# Patient Record
Sex: Female | Born: 1960 | Race: White | Hispanic: No | Marital: Married | Smoking: Never smoker
Health system: Southern US, Community
[De-identification: ages and names within clinical notes are randomized; demographics above are authoritative.]

## PROBLEM LIST (undated history)

## (undated) DIAGNOSIS — E785 Hyperlipidemia, unspecified: Secondary | ICD-10-CM

## (undated) DIAGNOSIS — T7840XA Allergy, unspecified, initial encounter: Secondary | ICD-10-CM

## (undated) DIAGNOSIS — K219 Gastro-esophageal reflux disease without esophagitis: Secondary | ICD-10-CM

## (undated) HISTORY — DX: Allergy, unspecified, initial encounter: T78.40XA

## (undated) HISTORY — DX: Gastro-esophageal reflux disease without esophagitis: K21.9

## (undated) HISTORY — PX: TONSILLECTOMY: SUR1361

## (undated) HISTORY — DX: Hyperlipidemia, unspecified: E78.5

## (undated) HISTORY — PX: TUBAL LIGATION: SHX77

---

## 2005-06-17 ENCOUNTER — Other Ambulatory Visit: Admission: RE | Admit: 2005-06-17 | Discharge: 2005-06-17 | Payer: Self-pay | Admitting: Family Medicine

## 2013-06-08 ENCOUNTER — Encounter (INDEPENDENT_AMBULATORY_CARE_PROVIDER_SITE_OTHER): Payer: Self-pay

## 2013-06-08 ENCOUNTER — Ambulatory Visit (INDEPENDENT_AMBULATORY_CARE_PROVIDER_SITE_OTHER): Payer: 59 | Admitting: General Practice

## 2013-06-08 ENCOUNTER — Encounter: Payer: Self-pay | Admitting: General Practice

## 2013-06-08 ENCOUNTER — Ambulatory Visit: Payer: Self-pay | Admitting: General Practice

## 2013-06-08 VITALS — BP 164/83 | HR 82 | Temp 97.0°F | Ht 62.0 in | Wt 260.0 lb

## 2013-06-08 DIAGNOSIS — K219 Gastro-esophageal reflux disease without esophagitis: Secondary | ICD-10-CM

## 2013-06-08 MED ORDER — OMEPRAZOLE 20 MG PO CPDR: 20.0000 mg | DELAYED_RELEASE_CAPSULE | Freq: Every day | ORAL | Status: DC

## 2013-06-08 NOTE — Patient Instructions (Signed)
Gastroesophageal Reflux Disease, Adult  Gastroesophageal reflux disease (GERD) happens when acid from your stomach flows up into the esophagus. When acid comes in contact with the esophagus, the acid causes soreness (inflammation) in the esophagus. Over time, GERD may create small holes (ulcers) in the lining of the esophagus.  CAUSES   · Increased body weight. This puts pressure on the stomach, making acid rise from the stomach into the esophagus.  · Smoking. This increases acid production in the stomach.  · Drinking alcohol. This causes decreased pressure in the lower esophageal sphincter (valve or ring of muscle between the esophagus and stomach), allowing acid from the stomach into the esophagus.  · Late evening meals and a full stomach. This increases pressure and acid production in the stomach.  · A malformed lower esophageal sphincter.  Sometimes, no cause is found.  SYMPTOMS   · Burning pain in the lower part of the mid-chest behind the breastbone and in the mid-stomach area. This may occur twice a week or more often.  · Trouble swallowing.  · Sore throat.  · Dry cough.  · Asthma-like symptoms including chest tightness, shortness of breath, or wheezing.  DIAGNOSIS   Your caregiver may be able to diagnose GERD based on your symptoms. In some cases, X-rays and other tests may be done to check for complications or to check the condition of your stomach and esophagus.  TREATMENT   Your caregiver may recommend over-the-counter or prescription medicines to help decrease acid production. Ask your caregiver before starting or adding any new medicines.   HOME CARE INSTRUCTIONS   · Change the factors that you can control. Ask your caregiver for guidance concerning weight loss, quitting smoking, and alcohol consumption.  · Avoid foods and drinks that make your symptoms worse, such as:  · Caffeine or alcoholic drinks.  · Chocolate.  · Peppermint or mint flavorings.  · Garlic and onions.  · Spicy foods.  · Citrus fruits,  such as oranges, lemons, or limes.  · Tomato-based foods such as sauce, chili, salsa, and pizza.  · Fried and fatty foods.  · Avoid lying down for the 3 hours prior to your bedtime or prior to taking a nap.  · Eat small, frequent meals instead of large meals.  · Wear loose-fitting clothing. Do not wear anything tight around your waist that causes pressure on your stomach.  · Raise the head of your bed 6 to 8 inches with wood blocks to help you sleep. Extra pillows will not help.  · Only take over-the-counter or prescription medicines for pain, discomfort, or fever as directed by your caregiver.  · Do not take aspirin, ibuprofen, or other nonsteroidal anti-inflammatory drugs (NSAIDs).  SEEK IMMEDIATE MEDICAL CARE IF:   · You have pain in your arms, neck, jaw, teeth, or back.  · Your pain increases or changes in intensity or duration.  · You develop nausea, vomiting, or sweating (diaphoresis).  · You develop shortness of breath, or you faint.  · Your vomit is green, yellow, black, or looks like coffee grounds or blood.  · Your stool is red, bloody, or black.  These symptoms could be signs of other problems, such as heart disease, gastric bleeding, or esophageal bleeding.  MAKE SURE YOU:   · Understand these instructions.  · Will watch your condition.  · Will get help right away if you are not doing well or get worse.  Document Released: 12/09/2004 Document Revised: 05/24/2011 Document Reviewed: 09/18/2010  [employer]® Patient   Information ©2014 [employer].

## 2013-06-08 NOTE — Progress Notes (Signed)
   Subjective:    Patient ID: Kendra Benitez, female    DOB: June 16, 1960, 53 y.o.   MRN: [id number]  Gastrophageal Reflux She complains of belching, choking, coughing and heartburn. She reports no abdominal pain, no chest pain or no sore throat. This is a new problem. The current episode started 1 to 4 weeks ago. The problem occurs frequently. The problem has been gradually worsening. The heartburn duration is an hour. The heartburn does not wake her from sleep. The heartburn does not limit her activity. The heartburn doesn't change with position. The symptoms are aggravated by certain foods and lying down. There are no known risk factors. She has tried an antacid for the symptoms. Past procedures do not include an EGD or esophageal pH monitoring.  Sinusitis This is a new problem. The current episode started in the past 7 days. The problem has been gradually worsening since onset. Associated symptoms include congestion, coughing and sinus pressure. Pertinent negatives include no chills, headaches, shortness of breath or sore throat. Past treatments include nothing.      Review of Systems  Constitutional: Negative for chills.  HENT: Positive for congestion and sinus pressure. Negative for sore throat.   Respiratory: Positive for cough and choking. Negative for chest tightness and shortness of breath.   Cardiovascular: Negative for chest pain and palpitations.  Gastrointestinal: Positive for heartburn. Negative for abdominal pain.  Neurological: Negative for dizziness, weakness and headaches.       Objective:   Physical Exam  Constitutional: She is oriented to person, place, and time. She appears well-developed and well-nourished.  HENT:  Head: Normocephalic and atraumatic.  Right Ear: External ear normal.  Left Ear: External ear normal.  Cardiovascular: Normal rate, regular rhythm and normal heart sounds.   Pulmonary/Chest: Effort normal and breath sounds normal. No respiratory distress.  She exhibits no tenderness.  Abdominal: Soft. Bowel sounds are normal. She exhibits no distension. There is no tenderness.  Neurological: She is alert and oriented to person, place, and time.  Skin: Skin is warm and dry.  Psychiatric: She has a normal mood and affect.          Assessment & Plan:  1. GERD (gastroesophageal reflux disease) - omeprazole (PRILOSEC) 20 MG capsule; Take 1 capsule (20 mg total) by mouth daily.  Dispense: 30 capsule; Refill: 11 -GERD diet discussed and provided  RTO prn Patient verbalized understanding Coralie KeensMae E. Bereket Gernert, FNP-C

## 2015-09-17 ENCOUNTER — Encounter: Payer: Self-pay | Admitting: Pediatrics

## 2015-09-17 ENCOUNTER — Ambulatory Visit (INDEPENDENT_AMBULATORY_CARE_PROVIDER_SITE_OTHER): Payer: Commercial Managed Care - HMO | Admitting: Pediatrics

## 2015-09-17 VITALS — BP 144/78 | HR 79 | Temp 97.1°F | Ht 62.0 in | Wt 242.4 lb

## 2015-09-17 DIAGNOSIS — Z0000 Encounter for general adult medical examination without abnormal findings: Secondary | ICD-10-CM | POA: Diagnosis not present

## 2015-09-17 DIAGNOSIS — I10 Essential (primary) hypertension: Secondary | ICD-10-CM

## 2015-09-17 DIAGNOSIS — IMO0001 Reserved for inherently not codable concepts without codable children: Secondary | ICD-10-CM

## 2015-09-17 DIAGNOSIS — R030 Elevated blood-pressure reading, without diagnosis of hypertension: Secondary | ICD-10-CM

## 2015-09-17 DIAGNOSIS — R5383 Other fatigue: Secondary | ICD-10-CM

## 2015-09-17 DIAGNOSIS — Z124 Encounter for screening for malignant neoplasm of cervix: Secondary | ICD-10-CM

## 2015-09-17 DIAGNOSIS — Z111 Encounter for screening for respiratory tuberculosis: Secondary | ICD-10-CM

## 2015-09-17 MED ORDER — LISINOPRIL 10 MG PO TABS: 10.0000 mg | ORAL_TABLET | Freq: Every day | ORAL | Status: DC

## 2015-09-17 NOTE — Progress Notes (Signed)
    Subjective:    Patient ID: Kendra Benitez, female    DOB: 1961/03/14, 55 y.o.   MRN: [id number]  CC: Annual Exam; Gynecologic Exam; and PPD Reading   HPI: Kendra Benitez is a 55 y.o. female presenting for Annual Exam; Gynecologic Exam; and PPD Reading  Lots of stress at home, feeling tired, Dad has been sick, has CHF No HA, CP  Otherwise feeling well Applying for job, needs PPD   FH: no breast ca or colon ca  Depression screen [facility] 2/9 09/17/2015  Decreased Interest 0  Down, Depressed, Hopeless 0  PHQ - 2 Score 0     Relevant past medical, surgical, family and social history reviewed and updated as indicated.  Interim medical history since our last visit reviewed. Allergies and medications reviewed and updated.  ROS: All systems negative other than what is in HPI  History  Smoking status  . Never Smoker   Smokeless tobacco  . Not on file       Objective:    BP 144/78 mmHg  Pulse 79  Temp(Src) 97.1 F (36.2 C) (Oral)  Ht '5\' 2"'$  (1.575 m)  Wt 242 lb 6.4 oz (109.952 kg)  BMI 44.32 kg/m2  LMP 05/13/2013  Wt Readings from Last 3 Encounters:  09/17/15 242 lb 6.4 oz (109.952 kg)  06/08/13 260 lb (117.935 kg)     Gen: NAD, alert, cooperative with exam, NCAT EYES: EOMI, no scleral injection or icterus ENT:  TMs pearly gray b/l, OP without erythema LYMPH: no cervical LAD CV: NRRR, normal S1/S2, no murmur, distal pulses 2+ b/l Resp: CTABL, no wheezes, normal WOB Abd: +BS, soft, NTND. no guarding or organomegaly Ext: No edema, warm Neuro: Alert and oriented, strength equal b/l UE and LE, coordination grossly normal MSK: normal muscle bulk GU: normal ext female genitalia, normal appearing cervix     Assessment & Plan:    [name] was seen today for annual exam, gynecologic exam and ppd reading.  Diagnoses and all orders for this visit:  Encounter for preventive health examination -     BMP8+EGFR -     CBC -     MM Digital Screening; Future -     Ambulatory  referral to Gastroenterology -     Lipid panel  PPD screening test -     PPD  Screening for cervical cancer -     Pap IG and HPV (high risk) DNA detection  Elevated blood pressure  Other fatigue -     VITAMIN D 25 Hydroxy (Vit-D Deficiency, Fractures) -     TSH  Essential hypertension Start lisinopril RTC 4 weeks for recheck -     lisinopril (PRINIVIL,ZESTRIL) 10 MG tablet; Take 1 tablet (10 mg total) by mouth daily.     Follow up plan: Return in about 4 weeks (around 10/15/2015). For BP follow up  Assunta Found, MD [facility] 09/17/2015, 2:54 PM

## 2015-09-19 LAB — PAP IG AND HPV HIGH-RISK
HPV, high-risk: NEGATIVE
PAP Smear Comment: 0

## 2015-09-19 LAB — TB SKIN TEST
Induration: 0 mm
TB Skin Test: NEGATIVE

## 2015-09-23 LAB — BMP8+EGFR
BUN / CREAT RATIO: 12 (ref 9–23)
BUN: 10 mg/dL (ref 6–24)
CO2: 24 mmol/L (ref 18–29)
Calcium: 9.5 mg/dL (ref 8.7–10.2)
Chloride: 102 mmol/L (ref 96–106)
Creatinine, Ser: 0.84 mg/dL (ref 0.57–1.00)
GFR calc Af Amer: 92 mL/min/{1.73_m2} (ref >59)
GFR, EST NON AFRICAN AMERICAN: 80 mL/min/{1.73_m2} (ref >59)
GLUCOSE: 89 mg/dL (ref 65–99)
Potassium: 4.6 mmol/L (ref 3.5–5.2)
SODIUM: 143 mmol/L (ref 134–144)

## 2015-09-23 LAB — VITAMIN D 25 HYDROXY (VIT D DEFICIENCY, FRACTURES): Vit D, 25-Hydroxy: 17.9 ng/mL — ABNORMAL LOW (ref 30.0–100.0)

## 2015-09-23 LAB — CBC
HEMATOCRIT: 42.6 % (ref 34.0–46.6)
Hemoglobin: 13.3 g/dL (ref 11.1–15.9)
MCH: 27.5 pg (ref 26.6–33.0)
MCHC: 31.2 g/dL — ABNORMAL LOW (ref 31.5–35.7)
MCV: 88 fL (ref 79–97)
PLATELETS: 277 10*3/uL (ref 150–379)
RBC: 4.83 x10E6/uL (ref 3.77–5.28)
RDW: 14.6 % (ref 12.3–15.4)
WBC: 5 10*3/uL (ref 3.4–10.8)

## 2015-09-23 LAB — LIPID PANEL
CHOL/HDL RATIO: 4 ratio (ref 0.0–4.4)
Cholesterol, Total: 194 mg/dL (ref 100–199)
HDL: 48 mg/dL (ref >39)
LDL CALC: 126 mg/dL — AB (ref 0–99)
TRIGLYCERIDES: 102 mg/dL (ref 0–149)
VLDL Cholesterol Cal: 20 mg/dL (ref 5–40)

## 2015-09-23 LAB — TSH: TSH: 3.42 u[IU]/mL (ref 0.450–4.500)

## 2015-09-25 MED ORDER — VITAMIN D (ERGOCALCIFEROL) 1.25 MG (50000 UNIT) PO CAPS: 50000.0000 [IU] | ORAL_CAPSULE | ORAL | Status: DC

## 2015-09-25 NOTE — Addendum Note (Signed)
Addended by: Johna SheriffVINCENT, Laelia Angelo L on: 09/25/2015 09:17 AM   Modules accepted: Orders

## 2015-10-13 ENCOUNTER — Encounter: Payer: Self-pay | Admitting: *Deleted

## 2015-10-15 ENCOUNTER — Encounter: Payer: Self-pay | Admitting: Pediatrics

## 2015-10-15 ENCOUNTER — Ambulatory Visit (INDEPENDENT_AMBULATORY_CARE_PROVIDER_SITE_OTHER): Payer: Commercial Managed Care - HMO | Admitting: Pediatrics

## 2015-10-15 VITALS — BP 134/78 | HR 81 | Temp 97.0°F | Ht 62.0 in | Wt 242.0 lb

## 2015-10-15 DIAGNOSIS — E559 Vitamin D deficiency, unspecified: Secondary | ICD-10-CM | POA: Diagnosis not present

## 2015-10-15 DIAGNOSIS — I10 Essential (primary) hypertension: Secondary | ICD-10-CM

## 2015-10-15 MED ORDER — VITAMIN D (ERGOCALCIFEROL) 1.25 MG (50000 UNIT) PO CAPS: 50000.0000 [IU] | ORAL_CAPSULE | ORAL | 0 refills | Status: DC

## 2015-10-15 NOTE — Progress Notes (Signed)
    Subjective:    Patient ID: Kendra Benitez, female    DOB: [birth year], 55 y.o.   MRN: [id number]  CC: Follow-up med problems  HPI: Kendra Benitez is a 55 y.o. female presenting for Follow-up  Feeling dizzy and lightheaded at times Stopped BP med BPs at home sometimes 100s/60s Feeling well now Trying to decrease sugar intake Please with weight loss over last year Increasing exercise No fevers, no chest pain No headaches   Depression screen [facility] 2/9 10/15/2015 09/17/2015  Decreased Interest 0 0  Down, Depressed, Hopeless 0 0  PHQ - 2 Score 0 0     Relevant past medical, surgical, family and social history reviewed and updated. Interim medical history since our last visit reviewed. Allergies and medications reviewed and updated.  History  Smoking Status  . Never Smoker  Smokeless Tobacco  . Never Used    ROS: Per HPI      Objective:    BP 134/78 (BP Location: Right Arm, Patient Position: Sitting, Cuff Size: Large)   Pulse 81   Temp 97 F (36.1 C) (Oral)   Ht 5\' 2"  (1.575 m)   Wt 242 lb (109.8 kg)   LMP 05/13/2013   BMI 44.26 kg/m   Wt Readings from Last 3 Encounters:  10/15/15 242 lb (109.8 kg)  09/17/15 242 lb 6.4 oz (110 kg)  06/08/13 260 lb (117.9 kg)     Gen: NAD, alert, cooperative with exam, NCAT EYES: EOMI, no scleral injection or icterus CV: NRRR, normal S1/S2, no murmur, distal pulses 2+ b/l Resp: CTABL, no wheezes, normal WOB Abd: +BS, soft, NTND. no guarding or organomegaly Ext: No edema, warm Neuro: Alert and oriented, strength equal b/l UE and LE, coordination grossly normal MSK: normal muscle bulk     Assessment & Plan:    Kendra Benitez was seen today for follow-up med problems  Diagnoses and all orders for this visit:  Vitamin D deficiency -     Vitamin D, Ergocalciferol, (DRISDOL) 50000 units CAPS capsule; Take 1 capsule (50,000 Units total) by mouth every 7 (seven) days.  Essential hypertension Cont lifestyle changes, stop  medication Cont to monitor at home, let me know if elevated  Morbid obesity, unspecified obesity type (HCC) Cont lifestyle changes, decrease sugar intake  Follow up plan: 1 year  Rex Kras, MD [facility] 10/15/2015, 4:55 PM

## 2015-10-20 ENCOUNTER — Encounter: Payer: Self-pay | Admitting: Gastroenterology

## 2015-11-24 ENCOUNTER — Encounter (INDEPENDENT_AMBULATORY_CARE_PROVIDER_SITE_OTHER): Payer: Self-pay

## 2015-11-24 ENCOUNTER — Ambulatory Visit (AMBULATORY_SURGERY_CENTER): Payer: Self-pay | Admitting: *Deleted

## 2015-11-24 VITALS — Ht 63.0 in | Wt 241.0 lb

## 2015-11-24 DIAGNOSIS — Z1211 Encounter for screening for malignant neoplasm of colon: Secondary | ICD-10-CM

## 2015-11-24 MED ORDER — NA SULFATE-K SULFATE-MG SULF 17.5-3.13-1.6 GM/177ML PO SOLN: 1.0000 | Freq: Once | ORAL | 0 refills | Status: AC

## 2015-11-24 NOTE — Progress Notes (Signed)
No egg or soy allergy known to patient  No issues with past sedation with any surgeries  or procedures, no intubation problems  No diet pills per patient No home 02 use per patient  No blood thinners per patient  Pt denies issues with constipation  No A fib or A flutter   

## 2015-11-27 ENCOUNTER — Encounter: Payer: Self-pay | Admitting: Gastroenterology

## 2015-12-08 ENCOUNTER — Encounter: Payer: Commercial Managed Care - HMO | Admitting: *Deleted

## 2015-12-08 ENCOUNTER — Ambulatory Visit (AMBULATORY_SURGERY_CENTER): Payer: Commercial Managed Care - HMO | Admitting: Gastroenterology

## 2015-12-08 ENCOUNTER — Encounter: Payer: Self-pay | Admitting: Gastroenterology

## 2015-12-08 VITALS — BP 158/79 | HR 68 | Temp 96.8°F | Resp 13 | Ht 63.0 in | Wt 241.0 lb

## 2015-12-08 DIAGNOSIS — Z1211 Encounter for screening for malignant neoplasm of colon: Secondary | ICD-10-CM

## 2015-12-08 MED ORDER — SODIUM CHLORIDE 0.9 % IV SOLN: 500.0000 mL | INTRAVENOUS | Status: DC

## 2015-12-08 NOTE — Patient Instructions (Signed)
YOU HAD AN ENDOSCOPIC PROCEDURE TODAY AT THE [facility]:   Refer to the procedure report that was given to you for any specific questions about what was found during the examination.  If the procedure report does not answer your questions, please call your gastroenterologist to clarify.  If you requested that your care partner not be given the details of your procedure findings, then the procedure report has been included in a sealed envelope for you to review at your convenience later.  YOU SHOULD EXPECT: Some feelings of bloating in the abdomen. Passage of more gas than usual.  Walking can help get rid of the air that was put into your GI tract during the procedure and reduce the bloating. If you had a lower endoscopy (such as a colonoscopy or flexible sigmoidoscopy) you may notice spotting of blood in your stool or on the toilet paper. If you underwent a bowel prep for your procedure, you may not have a normal bowel movement for a few days.  Please Note:  You might notice some irritation and congestion in your nose or some drainage.  This is from the oxygen used during your procedure.  There is no need for concern and it should clear up in a day or so.  SYMPTOMS TO REPORT IMMEDIATELY:   Following lower endoscopy (colonoscopy or flexible sigmoidoscopy):  Excessive amounts of blood in the stool  Significant tenderness or worsening of abdominal pains  Swelling of the abdomen that is new, acute  Fever of 100F or higher   For urgent or emergent issues, a gastroenterologist can be reached at any hour by calling [phone].   DIET:  We do recommend a small meal at first, but then you may proceed to your regular diet.  Drink plenty of fluids but you should avoid alcoholic beverages for 24 hours. Try to increase the fiber in your diet, and drink plenty of water.  ACTIVITY:  You should plan to take it easy for the rest of today and you should NOT DRIVE or use heavy machinery until  tomorrow (because of the sedation medicines used during the test).    FOLLOW UP: Our staff will call the number listed on your records the next business day following your procedure to check on you and address any questions or concerns that you may have regarding the information given to you following your procedure. If we do not reach you, we will leave a message.  However, if you are feeling well and you are not experiencing any problems, there is no need to return our call.  We will assume that you have returned to your regular daily activities without incident.  If any biopsies were taken you will be contacted by phone or by letter within the next 1-3 weeks.  Please call us at [phone] if you have not heard about the biopsies in 3 weeks.    SIGNATURES/CONFIDENTIALITY: You and/or your care partner have signed paperwork which will be entered into your electronic medical record.  These signatures attest to the fact that that the information above on your After Visit Summary has been reviewed and is understood.  Full responsibility of the confidentiality of this discharge information lies with you and/or your care-partner.  Read all of the handouts given to you by your recovery room nurse.  Thank-you for choosing us for your healthcare needs today. 

## 2015-12-08 NOTE — Op Note (Signed)
[facility] Patient Name: Kendra Benitez Procedure Date: 12/08/2015 11:09 AM MRN: [id number] Endoscopist: Meryl Dare , MD Age: 55 Referring MD:  Date of Birth: 1960-05-23 Gender: Female Account #: [id number] Procedure:                Colonoscopy Indications:              Screening for colorectal malignant neoplasm Medicines:                Monitored Anesthesia Care Procedure:                Pre-Anesthesia Assessment:                           - Prior to the procedure, a History and Physical                            was performed, and patient medications and                            allergies were reviewed. The patient's tolerance of                            previous anesthesia was also reviewed. The risks                            and benefits of the procedure and the sedation                            options and risks were discussed with the patient.                            All questions were answered, and informed consent                            was obtained. Prior Anticoagulants: The patient has                            taken no previous anticoagulant or antiplatelet                            agents. ASA Grade Assessment: II - A patient with                            mild systemic disease. After reviewing the risks                            and benefits, the patient was deemed in                            satisfactory condition to undergo the procedure.                           After obtaining informed consent, the colonoscope  was passed under direct vision. Throughout the                            procedure, the patient's blood pressure, pulse, and                            oxygen saturations were monitored continuously. The                            Model PCF-H190L [id number](SN#[id number]) scope was introduced                            through the anus and advanced to the the cecum,                            identified by  appendiceal orifice and ileocecal                            valve. The ileocecal valve, appendiceal orifice,                            and rectum were photographed. The quality of the                            bowel preparation was excellent. The colonoscopy                            was performed without difficulty. The patient                            tolerated the procedure well. Scope In: 11:31:49 AM Scope Out: 11:44:20 AM Scope Withdrawal Time: 0 hours 9 minutes 59 seconds  Total Procedure Duration: 0 hours 12 minutes 31 seconds  Findings:                 The perianal and digital rectal examinations were                            normal.                           External hemorrhoids were found during                            retroflexion. The hemorrhoids were small.                           The exam was otherwise without abnormality on                            direct and retroflexion views. Complications:            No immediate complications. Estimated blood loss:                            None. Estimated Blood Loss:  Estimated blood loss: none. Impression:               - External hemorrhoids.                           - The examination was otherwise normal on direct                            and retroflexion views.                           - No specimens collected. Recommendation:           - Repeat colonoscopy in 10 years for screening                            purposes.                           - Patient has a contact number available for                            emergencies. The signs and symptoms of potential                            delayed complications were discussed with the                            patient. Return to normal activities tomorrow.                            Written discharge instructions were provided to the                            patient.                           - Resume previous diet.                           -  Continue present medications. Meryl Dare, MD 12/08/2015 11:48:49 AM This report has been signed electronically.

## 2015-12-08 NOTE — Progress Notes (Signed)
Report to PACU, RN, vss, BBS= Clear.  

## 2015-12-09 ENCOUNTER — Telehealth: Payer: Self-pay | Admitting: *Deleted

## 2015-12-09 ENCOUNTER — Telehealth: Payer: Self-pay

## 2015-12-09 NOTE — Telephone Encounter (Signed)
  Follow up Call-  Call back number 12/08/2015  Post procedure Call Back phone  # [phone]  Permission to leave phone message Yes  Some recent data might be hidden    [facility]

## 2015-12-09 NOTE — Telephone Encounter (Signed)
  Follow up Call-  Call back number 12/08/2015  Post procedure Call Back phone  # [phone]  Permission to leave phone message Yes  Some recent data might be hidden    Patient was called for follow up after her procedure on 12/08/2015. This was a second attempt to reach the patient. No answer at the number given for follow up phone call. A message was left on the answering machine.

## 2016-06-29 ENCOUNTER — Ambulatory Visit (INDEPENDENT_AMBULATORY_CARE_PROVIDER_SITE_OTHER): Payer: Commercial Managed Care - HMO | Admitting: Family

## 2016-06-29 ENCOUNTER — Encounter: Payer: Self-pay | Admitting: Family

## 2016-06-29 VITALS — BP 134/70 | HR 86 | Temp 98.8°F | Ht 63.0 in | Wt 241.0 lb

## 2016-06-29 DIAGNOSIS — S83411A Sprain of medial collateral ligament of right knee, initial encounter: Secondary | ICD-10-CM | POA: Diagnosis not present

## 2016-06-29 DIAGNOSIS — R0683 Snoring: Secondary | ICD-10-CM | POA: Diagnosis not present

## 2016-06-29 MED ORDER — PREDNISONE 10 MG (21) PO TBPK: ORAL_TABLET | ORAL | 0 refills | Status: DC

## 2016-06-29 MED ORDER — NAPROXEN 500 MG PO TABS: 500.0000 mg | ORAL_TABLET | Freq: Two times a day (BID) | ORAL | 1 refills | Status: DC

## 2016-06-29 NOTE — Progress Notes (Signed)
   Subjective:    Patient ID: Kendra Benitez, female    DOB: Dec 13, 1960, 56 y.o.   MRN: [id number]  Knee Pain   The incident occurred more than 1 week ago. The injury mechanism was a fall. The pain is present in the right knee. The quality of the pain is described as aching. The pain is moderate. The pain has been intermittent since onset. Pertinent negatives include no inability to bear weight or loss of motion. She reports no foreign bodies present. She has tried acetaminophen, rest and non-weight bearing for the symptoms. The treatment provided mild relief.    Pt also complaining of snoring that her husband has noticed. PT states it has been going on over the last two years, but has become worse stating she wakes herself up.   Review of Systems  Musculoskeletal: Positive for joint swelling.  All other systems reviewed and are negative.      Objective:   Physical Exam  Constitutional: She is oriented to person, place, and time. She appears well-developed and well-nourished. No distress.  HENT:  Head: Normocephalic.  Eyes: Pupils are equal, round, and reactive to light.  Cardiovascular: Normal rate, regular rhythm, normal heart sounds and intact distal pulses.   No murmur heard. Pulmonary/Chest: Effort normal and breath sounds normal. No respiratory distress. She has no wheezes.  Abdominal: Soft. Bowel sounds are normal. She exhibits no distension. There is no tenderness.  Musculoskeletal: Normal range of motion. She exhibits edema (trace) and tenderness (right medial knee pain with flexion or extension).  Neurological: She is alert and oriented to person, place, and time.  Skin: Skin is warm and dry.  Psychiatric: She has a normal mood and affect. Her behavior is normal. Judgment and thought content normal.  Vitals reviewed.     BP 134/70   Pulse 86   Temp 98.8 F (37.1 C) (Oral)   Ht  (1.6 m)   Wt 241 lb (109.3 kg)   LMP 05/13/2013   BMI 42.69 kg/m      Assessment  & Plan:  1. Sprain of medial collateral ligament of right knee, initial encounter -Rest -Ice  ROM exercises discussed- handout given RTO if pain does not improve or worsen - naproxen (NAPROSYN) 500 MG tablet; Take 1 tablet (500 mg total) by mouth 2 (two) times daily with a meal.  Dispense: 60 tablet; Refill: 1 - predniSONE (STERAPRED UNI-PAK 21 TAB) 10 MG (21) TBPK tablet; Use as directed  Dispense: 21 tablet; Refill: 0  2. Snoring - Ambulatory referral to Neurology   Jannifer Rodney, FNP

## 2016-06-29 NOTE — Patient Instructions (Signed)
Knee Sprain, Adult A knee sprain is a stretch or tear in a knee ligament. Knee ligaments are bands of tissue that connect bones in the knee to each other. What are the causes? This condition often results from:  A fall.  An injury to the knee.  What are the signs or symptoms? Symptoms of this condition include:  Trouble bending the leg.  Swelling in the knee.  Bruising around the knee.  Tenderness or pain in the knee.  Muscle spasms around the knee.  How is this diagnosed? This condition may be diagnosed based on:  A physical exam.  What happened just before you started to have symptoms.  Tests, including: ? An X-ray. This may be done to make sure no bones are broken. ? An MRI. This may be done to check if the ligament is torn. ? Stress testing of the knee. This may be done to check ligament damage.  How is this treated? Treatment for this condition may involve:  Keeping the knee still (immobilized) with a cast, brace, or splint.  Applying ice to the knee. This helps with pain and swelling.  Keeping the knee raised (elevated) above the level of your heart when you are resting. This helps with pain and swelling.  Taking medicine for pain.  Exercises to prevent or limit permanent weakness or stiffness in your knee.  Surgery to reconnect the ligament to the bone or to reconstruct it. This may be needed if the ligament tore all the way.  Follow these instructions at home: If you have a splint or brace:  Wear the splint or brace as told by your health care provider. Remove it only as told by your health care provider.  Loosen the splint or brace if your toes tingle, become numb, or turn cold and blue.  Keep the splint or brace clean.  If the splint or brace is not waterproof: ? Do not let it get wet. ? Cover it with a watertight covering when you take a bath or a shower. If you have a cast:  Do not stick anything inside the cast to scratch your skin. Doing  that increases your risk of infection.  Check the skin around the cast every day. Tell your health care provider about any concerns.  You may put lotion on dry skin around the edges of the cast. Do not put lotion on the skin underneath the cast.  Keep the cast clean.  If the cast is not waterproof: ? Do not let it get wet. ? Cover it with a watertight covering when you take a bath or a shower. Managing pain, stiffness, and swelling   If directed, put ice on the injured area. ? If you have a removable splint or brace, remove it as told by your health care provider. ? Put ice in a plastic bag. ? Place a towel between your skin and the bag or between your cast and the bag. ? Leave the ice on for 20 minutes, 2-3 times a day.  Gently move your toes often to avoid stiffness and to lessen swelling.  Elevate the injured area above the level of your heart while you are sitting or lying down.  Take over-the-counter and prescription medicines only as told by your health care provider. General instructions  Do exercises as told by your health care provider.  Keep all follow-up visits as told by your health care provider. This is important. Contact a health care provider if:  You have pain   that gets worse.  The cast, brace, or splint does not fit right.  The cast, brace, or splint gets damaged. Get help right away if:  You cannot use your injured joint to support any of your body weight (cannot bear weight).  You cannot move the injured joint.  You cannot walk more than a few steps without pain or without your knee buckling.  You have significant pain, swelling, or numbness below the cast, brace, or splint. This information is not intended to replace advice given to you by your health care provider. Make sure you discuss any questions you have with your health care provider. Document Released: 03/01/2005 Document Revised: 11/19/2015 Document Reviewed: 09/19/2015 Elsevier Interactive  Patient Education  2017 [employer] Medial Collateral Knee Ligament Sprain, Phase II Rehab These exercises are more advanced than phase I exercises. Ask your health care provider which exercises are safe for you. Do exercises exactly as told by your health care provider and adjust them as directed. It is normal to feel mild stretching, pulling, tightness, or discomfort as you do these exercises, but you should stop right away if you feel sudden pain or your pain gets worse. Do not begin these exercises until told by your health care provider. Strengthening exercises These exercises build strength and endurance in your knee. Endurance is the ability to use your muscles for a long time, even after they get tired. Exercise A: Straight leg raises (  quadriceps) 1. Lie on your back with your left / right leg extended and your other knee bent. 2. Tense the muscles in the front of your left / right thigh. You should see your kneecap slide up or see increased dimpling just above the knee. 3. Keep your muscles tight as you raise your leg 4-6 inches (10-15 cm) off the floor. 4. Hold this position for __________ seconds. 5. Keeping the muscles tense as you lower your leg. 6. Relax the muscles slowly and completely. Repeat __________ times. Complete this exercise __________ times a day. Exercise B: Hamstring curls  If told by your health care provider, do this exercise while wearing ankle weights. Begin with __________ weights. Then increase the weight by 1 lb (0.5 kg) increments. Do not wear ankle weights that are more than __________. 1. Lie on your abdomen with your legs straight. 2. Keeping your hips flat, bend your left / right knee as far as you comfortably can. 3. Hold this position for __________ seconds. 4. Slowly lower your leg to the starting position. Repeat __________ times. Complete this exercise __________ times a day. Exercise C: Wall slides ( quadriceps) 1. Lean your back against a  smooth wall or door, and walk your feet out 18-24 inches (46-61 cm) from it. 2. Place your feet hip-width apart. 3. Slowly slide down the wall or door until your knees bend __________ degrees. Your knees should be over your ankles. Do not let your knees fall inward. 4. Hold for __________ seconds. 5. Stand up to rest for __________ seconds. Repeat __________ times. Complete this exercise __________ times a day. Exercise D: Straight leg raises ( hip abductors) 1. Lie on your side with your left / right leg in the top position. Lie so your head, shoulder, knee, and hip line up. You may bend your lower knee to help you keep your balance. 2. Roll your hips slightly forward so your hips are stacked directly over each other and your left / right knee is facing forward. 3. Leading with your heel,  lift your top leg 4-6 inches (10-15 cm). You should feel the muscles in your outer hip lifting.  Do not let your foot drift forward.  Do not let your knee roll toward the ceiling. 4. Hold this position for __________ seconds. 5. Slowly return to the starting position. 6. Let your muscles relax completely. Repeat __________ times. Complete this exercise __________ times a day. Exercise E: Clamshell ( hip abductors and external rotators) 1. Lie on your left / right side with your knees bent to about 60 degrees. 2. Put your legs on top of each other and your feet together. You can put a pillow between your knees for comfort. 3. Keeping your feet together, lift your top knee up and back. Do not let your body roll backward. 4. Hold for ________seconds. 5. Return to the starting position. Repeat __________ times. Do both sides if told by your health care provider. Complete this exercise __________ times a day. Exercise F: Bridge ( hip extensors) 1. Lie on your back on a firm surface with your knees bent and your feet flat on the floor. 2. Tighten your abdominal and buttocks muscles and lift your bottom off  the floor until your trunk is level with your thighs.  Do not arch your back.  You should feel the muscles working in your buttocks and the back of your thighs. 3. Hold this position for __________ seconds. 4. Slowly lower your hips to the starting position. 5. Let your muscles relax completely between repetitions. 6. If this exercise is too easy, try lifting one leg 2-4 inches (5-10 cm) off the floor while your bottom is up off the floor. Repeat __________ times. Complete this exercise __________ times a day. This information is not intended to replace advice given to you by your health care provider. Make sure you discuss any questions you have with your health care provider. Document Released: 06/22/2005 Document Revised: 11/06/2015 Document Reviewed: 01/11/2015 Elsevier Interactive Patient Education  2017 [employer].

## 2016-07-19 ENCOUNTER — Encounter: Payer: Self-pay | Admitting: Neurology

## 2016-07-19 ENCOUNTER — Ambulatory Visit (INDEPENDENT_AMBULATORY_CARE_PROVIDER_SITE_OTHER): Payer: Commercial Managed Care - HMO | Admitting: Neurology

## 2016-07-19 VITALS — BP 191/93 | HR 84 | Resp 20 | Ht 63.0 in | Wt 241.0 lb

## 2016-07-19 DIAGNOSIS — G4730 Sleep apnea, unspecified: Secondary | ICD-10-CM | POA: Diagnosis not present

## 2016-07-19 DIAGNOSIS — G4710 Hypersomnia, unspecified: Secondary | ICD-10-CM

## 2016-07-19 DIAGNOSIS — R0683 Snoring: Secondary | ICD-10-CM | POA: Diagnosis not present

## 2016-07-19 DIAGNOSIS — F5102 Adjustment insomnia: Secondary | ICD-10-CM | POA: Diagnosis not present

## 2016-07-19 NOTE — Progress Notes (Signed)
SLEEP MEDICINE CLINIC   Provider:  Melvyn Novas, M D  Primary Care Physician:  Johna Sheriff, MD   Referring Provider: Jannifer Rodney, NP    Chief Complaint  Patient presents with  . New Patient (Initial Visit)    snores, never had a sleep study    HPI:  Kendra Benitez is a 56 y.o. female , seen here as in a referral from NP Hawkes/ MD Oswaldo Done .   I have the pleasure to see Kendra Benitez today, a 56 year old right-handed Caucasian female patient referred for a chief complaint of snoring. The patient reports being a loud snorer and her family is bothered by it, especially her husband. The patient reports no recent significant weight gain, and snoring has been going on for years. She also has mild hypertension, she had a regular physical exam with her primary care physician once a year.  Chief complaint according to patient : " I wake the whole house snoring"   Sleep habits are as follows: [name] prefers to read or watch television in the evening before going to bed. [name]'s bedtime is around 10 PM, she falls asleep easily and promptly. Her bedroom is cool quiet and dark. She shares a bedroom with her husband who is bothered by her snoring. She reports him not to snore at all. She prefers to sleep on her side, and she sleeps on 2 pillows. She does not report vivid dreams or nightmares, and usually can sleep through the night until 6 AM, she does not rely on an alarm but on her husband to wake her. She reports neither dizziness nor nausea, no palpitations nor diaphoresis in the morning.  Sleep medical history and family sleep history: no family members diagnosed with apnea, mother is a snorer. Father had CHF, DM , HTN, COPD and died in 03-17-18of a pulmonary embolus.   Referral notes do not explain why this patient is here today; here is a cop of her PCP notes.  HPI: Kendra Benitez is a 56 y.o. female presenting for Annual Exam; Gynecologic Exam; and PPD Reading Lots of  stress at home, feeling tired, Dad has been sick, has CHF No HA, CP. Otherwise feeling well. Applying for job, needs PPD   FH: no breast ca or colon ca  Depression screen [facility] 2/9 09/17/2015  Decreased Interest 0  Down, Depressed, Hopeless 0  PHQ - 2 Score 0   Relevant past medical, surgical, family and social history reviewed and updated as indicated.  Interim medical history since our last visit reviewed. Allergies and medications reviewed and updated. ROS: All systems negative other than what is in HPI  History  Smoking status  . Never Smoker   Smokeless tobacco  . Not on file    Social history:  [name] does not smoke or use other forms of tobacco and she does not drink alcohol. Caffeine use is about 1 L caffeinated soda a day.  Review of Systems: Out of a complete 14 system review, the patient complains of only the following symptoms, and all other reviewed systems are negative. Fatigue severity scale endorsed at 9 points, Epworth sleepiness scale endorsed at 3 points, review of systems endorsed for snoring.   Social History   Social History  . Marital status: Married    Spouse name: N/A  . Number of children: N/A  . Years of education: N/A   Occupational History  . Not on file.   Social  History Main Topics  . Smoking status: Never Smoker  . Smokeless tobacco: Never Used  . Alcohol use No  . Drug use: No  . Sexual activity: Not on file   Other Topics Concern  . Not on file   Social History Narrative  . No narrative on file    Family History  Problem Relation Age of Onset  . Hypertension Mother   . Hypertension Father   . Diabetes Father   . Colon cancer Neg Hx   . Colon polyps Neg Hx   . Rectal cancer Neg Hx   . Stomach cancer Neg Hx     Past Medical History:  Diagnosis Date  . Allergy   . GERD (gastroesophageal reflux disease)    history of   . Hyperlipidemia     Past Surgical History:  Procedure Laterality Date  . TONSILLECTOMY    .  TUBAL LIGATION      No current outpatient prescriptions on file.   No current facility-administered medications for this visit.     Allergies as of 07/19/2016  . (No Known Allergies)    Vitals: BP (!) 191/93   Pulse 84   Resp 20   Ht 5\' 3"  (1.6 m)   Wt 241 lb (109.3 kg)   LMP 05/13/2013   BMI 42.69 kg/m  Last Weight:  Wt Readings from Last 1 Encounters:  07/19/16 241 lb (109.3 kg)   ZOX:WRUE mass index is 42.69 kg/m.     Last Height:   Ht Readings from Last 1 Encounters:  07/19/16 5\' 3"  (1.6 m)    Physical exam:  General: The patient is awake, alert and appears not in acute distress. The patient is well groomed. Head: Normocephalic, atraumatic. Neck is supple. Mallampati 3,  neck circumference:15. Nasal airflow restricted , Retrognathia is seen. All natural teeth.  Cardiovascular:  Regular rate and rhythm , without  murmurs or carotid bruit, and without distended neck veins. Respiratory: Lungs are clear to auscultation. Skin:  Without evidence of edema, or rash Trunk: BMI is at 43 , morbidly obese. The patient's posture is erect/    Neurologic exam : The patient is awake and alert, oriented to place and time.   Memory subjective described as intact. Attention span & concentration ability appears normal.  Speech is fluent, no  dysarthria, dysphonia or aphasia.  Mood and affect are appropriate.  Cranial nerves: Pupils are equal and briskly reactive to light. Funduscopic exam without  evidence of pallor or edema. Extraocular movements  in vertical and horizontal planes intact and without nystagmus. Visual fields by finger perimetry are intact.Hearing to finger rub intact. Facial sensation intact to fine touch. Facial motor strength is symmetric and tongue and uvula move midline. Shoulder shrug was symmetrical.   Motor exam: Normal tone, muscle bulk and symmetric strength in all extremities. She reports subjectiv weakness in the thigh and foot pain.  Sensory:  Fine  touch, pinprick and vibration were tested in all extremities, except areas of swelling. Marland [employer] Proprioception tested in the upper extremities was normal. Coordination: Rapid alternating movements in the fingers/hands was normal. Finger-to-nose maneuver  - normal without evidence of ataxia, dysmetria or tremor. Gait and station: Patient walks without assistive device and is able unassisted to climb up to the exam table. Strength within normal limits.Stance is stable and normal.  Deep tendon reflexes: in the  upper and lower extremities are symmetric and intact. Babinski maneuver response is attenuatd.    Assessment:  After physical and neurologic  examination, review of laboratory studies,  Personal review of imaging studies, reports of other  studies, results of pre-existing records as far as provided in visit., my assessment is   1) Mrs. Schiavi's neck circumference, her elevated body mass index and the report of snoring loudly at night all other risk factors for the presence of obstructive sleep apnea. I would like to obtain a sleep study to confirm my suspicion and to initiate treatment. She is with [employer]>   2) morbidly obese. Needs to lose weight soon.   3) Snoring, if not accompanied by apnea, will probably be treated with a dental device.    The patient was advised of the nature of the diagnosed disorder , the treatment options and the  risks for general health and wellness arising from not treating the condition.   I spent more than 40 minutes of face to face time with the patient.  Greater than 50% of time was spent in counseling and coordination of care. We have discussed the diagnosis and differential and I answered the patient's questions.    Plan:  Treatment plan and additional workup :  Mrs. Cressman's  Mallampati are suggestive of mild to moderate sleep apnea being present. I will order a home sleep test for the patient we will follow-up after the test has been interpreted.    Melvyn NovasARMEN  Ferron Ishmael, MD 07/19/2016, 2:30 PM  Certified in Neurology by ABPN Certified in Sleep Medicine by [facility]  [facility] [address], [address] [place], [place] [address]

## 2016-07-19 NOTE — Patient Instructions (Signed)

## 2016-08-11 ENCOUNTER — Encounter: Payer: Commercial Managed Care - HMO | Admitting: Neurology

## 2016-09-20 ENCOUNTER — Ambulatory Visit (INDEPENDENT_AMBULATORY_CARE_PROVIDER_SITE_OTHER): Payer: Commercial Managed Care - HMO | Admitting: Neurology

## 2016-09-20 DIAGNOSIS — G4734 Idiopathic sleep related nonobstructive alveolar hypoventilation: Principal | ICD-10-CM

## 2016-09-20 DIAGNOSIS — G4733 Obstructive sleep apnea (adult) (pediatric): Secondary | ICD-10-CM

## 2016-09-22 ENCOUNTER — Other Ambulatory Visit: Payer: Self-pay | Admitting: Neurology

## 2016-09-22 DIAGNOSIS — G4734 Idiopathic sleep related nonobstructive alveolar hypoventilation: Secondary | ICD-10-CM

## 2016-09-22 DIAGNOSIS — G4733 Obstructive sleep apnea (adult) (pediatric): Secondary | ICD-10-CM

## 2016-09-22 NOTE — Procedures (Signed)
[facility] [address] [place], [place] [address] NAME: Kendra Benitez     DOB: 05/05/1960 MEDICAL RECORD No: [id number] DOS: 09/20/2016   REFERRING PHYSICIAN: Jannifer Rodneyhristy Hawks, NP STUDY PERFORMED: HST HISTORY:  Kendra Benitez, a 56 year old right-handed Caucasian female patient was referred for a chief complaint of snoring. The patient reports being a loud snorer and her family is bothered by it, especially her husband. The patient reports no recent significant weight gain, and snoring has been going on for years. She also has mild hypertension. " I wake the whole house snoring"  Epworth Sleepiness Score endorsed at : 3 points,  Fatigue Severity  Score: 9 points, BMI was 43.  STUDY RESULTS: Total Recording Time: 7h 5826m.  Total Apnea/Hypopnea Index (AHI): 14.8/hr. RDI was 16.4/hr.  Average Oxygen Saturation: SpO2 at 90%. Lowest Oxygen Saturation: 79% and total desaturation time 125 minutes at or below 88%. Average Heart Rate: 77 bpm (between 57 and 103 bpm). IMPRESSION:  Mild, predominantly obstructive sleep apnea, associated with prolonged desaturation. No tachy- bradycardia. RECOMMENDATION: Return for CPAP titration.  An attended sleep study serves this patient better as oxygen may need to be applied as well as CPAP. Only CPAP will address the hypoxia, apnea and snoring as was documented in this HST. A dental device does not treat hypoxemia. Weight loss is strongly recommended.  I certify that I have reviewed the raw data recording prior to the issuance of this report in accordance with the standards of the American Academy of Sleep Medicine (AASM). Melvyn Novasarmen Jerusalem Wert, MD  09-22-2016  [facility] Medical Director Diplomat, ABPN and ABSM Accredited by the AASM

## 2016-09-23 ENCOUNTER — Telehealth: Payer: Self-pay | Admitting: Neurology

## 2016-09-23 DIAGNOSIS — G4730 Sleep apnea, unspecified: Principal | ICD-10-CM

## 2016-09-23 DIAGNOSIS — G4710 Hypersomnia, unspecified: Secondary | ICD-10-CM

## 2016-09-23 NOTE — Telephone Encounter (Signed)
[employer] denied CPAP suggested Auto pap °

## 2016-09-23 NOTE — Telephone Encounter (Signed)
[employer] denied CPAP titration. Will order auto PAP. 5-15 cm water, mask of choice.

## 2016-09-23 NOTE — Telephone Encounter (Signed)
-----   Message from Melvyn Novasarmen Dohmeier, MD sent at 09/22/2016  5:45 PM EDT ----- This HST documented apnea, snoring and prolonged total oxygen desaturation time. I recommend a return for an attended CPAP titration based on hypoxemia, which may require oxygen supplementation if not responsive to CPAP/ BIPAP.

## 2016-09-23 NOTE — Telephone Encounter (Signed)
Called pt to discuss sleep study results. No answer at this time. LVM for pt to call back 

## 2016-09-27 ENCOUNTER — Telehealth: Payer: Self-pay | Admitting: Neurology

## 2016-09-27 NOTE — Telephone Encounter (Signed)
Pt returned call. I was able to discuss the sleep study results.  I advised pt that Dr. Vickey Hugerohmeier reviewed their sleep study results and found that pt does have a sleep apnea . Dr. Vickey Hugerohmeier recommends that pt starts CPAP. I reviewed PAP compliance expectations with the pt. Pt is agreeable to starting a CPAP. I advised pt that an order will be sent to a DME, [facility], and [facility] will call the pt within about one week after they file with the pt's insurance. [facility] will show the pt how to use the machine, fit for masks, and troubleshoot the CPAP if needed. A follow up appt was made for insurance purposes with Dr. Vickey Hugerohmeier on Dec 22 2016 10:00am. Pt verbalized understanding to arrive 15 minutes early and bring their CPAP. A letter with all of this information in it will be mailed to the pt as a reminder. I verified with the pt that the address we have on file is correct. Pt verbalized understanding of results. Pt had no questions at this time but was encouraged to call back if questions arise.

## 2016-09-27 NOTE — Telephone Encounter (Signed)
Pt called back, she will be off work at 4:30, please call then if possible

## 2016-10-07 NOTE — Telephone Encounter (Signed)
Called pt to touch base with her and let her know that we got her set up with [facility] and wanted to see if she had any luck with getting that taken care of.

## 2016-12-22 ENCOUNTER — Ambulatory Visit: Payer: Self-pay | Admitting: Neurology

## 2016-12-23 ENCOUNTER — Encounter: Payer: Self-pay | Admitting: Neurology

## 2017-01-20 ENCOUNTER — Ambulatory Visit: Payer: 59 | Admitting: Neurology

## 2017-01-21 ENCOUNTER — Encounter: Payer: Self-pay | Admitting: Neurology

## 2017-09-13 ENCOUNTER — Encounter: Payer: Self-pay | Admitting: Family Medicine

## 2017-09-13 ENCOUNTER — Ambulatory Visit: Payer: 59 | Admitting: Family Medicine

## 2017-09-13 VITALS — BP 146/83 | HR 101 | Temp 98.6°F | Wt 246.4 lb

## 2017-09-13 DIAGNOSIS — N3001 Acute cystitis with hematuria: Secondary | ICD-10-CM

## 2017-09-13 LAB — URINALYSIS
BILIRUBIN UA: NEGATIVE
Glucose, UA: NEGATIVE
Ketones, UA: NEGATIVE
NITRITE UA: POSITIVE — AB
PH UA: 5.5 (ref 5.0–7.5)
SPEC GRAV UA: 1.02 (ref 1.005–1.030)
Urobilinogen, Ur: 0.2 mg/dL (ref 0.2–1.0)

## 2017-09-13 MED ORDER — CEPHALEXIN 500 MG PO CAPS: 500.0000 mg | ORAL_CAPSULE | Freq: Three times a day (TID) | ORAL | 0 refills | Status: DC

## 2017-09-13 NOTE — Progress Notes (Signed)
   HPI  Patient presents today here with concern for UTI.  Patient explains she has had dysuria and low back pain for 2 weeks that is unusual for her.  She denies any fever, chills, sweats.  She is tolerating food and fluids like usual by mouth.  PMH: Smoking status noted ROS: Per HPI  Objective: BP (!) 146/83 (BP Location: Left Arm, Patient Position: Sitting, Cuff Size: Large)   Pulse (!) 101   Temp 98.6 F (37 C) (Oral)   Wt 246 lb 6.4 oz (111.8 kg)   LMP 05/13/2013   BMI 43.65 kg/m  Gen: NAD, alert, cooperative with exam HEENT: NCAT, EOMI, PERRL CV: RRR, good S1/S2, no murmur Resp: CTABL, no wheezes, non-labored Abd: Soft, no CVA tenderness, no suprapubic tenderness to palpation Ext: No edema, warm Neuro: Alert and oriented, No gross deficits  Assessment and plan:  #UTI Treat with Keflex, culture Patient with slight increased heart rate tonight, however no other signs of complicated UTI. RTC with any concerns     Orders Placed This Encounter  Procedures  . Urine Culture  . Urinalysis    Meds ordered this encounter  Medications  . cephALEXin (KEFLEX) 500 MG capsule    Sig: Take 1 capsule (500 mg total) by mouth 3 (three) times daily.    Dispense:  21 capsule    Refill:  0    Murtis SinkSam Bradshaw, MD [facility] 09/13/2017, 6:34 PM

## 2017-09-13 NOTE — Patient Instructions (Signed)
Great to meet you!   Urinary Tract Infection, Adult A urinary tract infection (UTI) is an infection of any part of the urinary tract, which includes the kidneys, ureters, bladder, and urethra. These organs make, store, and get rid of urine in the body. UTI can be a bladder infection (cystitis) or kidney infection (pyelonephritis). What are the causes? This infection may be caused by fungi, viruses, or bacteria. Bacteria are the most common cause of UTIs. This condition can also be caused by repeated incomplete emptying of the bladder during urination. What increases the risk? This condition is more likely to develop if:  You ignore your need to urinate or hold urine for long periods of time.  You do not empty your bladder completely during urination.  You wipe back to front after urinating or having a bowel movement, if you are female.  You are uncircumcised, if you are female.  You are constipated.  You have a urinary catheter that stays in place (indwelling).  You have a weak defense (immune) system.  You have a medical condition that affects your bowels, kidneys, or bladder.  You have diabetes.  You take antibiotic medicines frequently or for long periods of time, and the antibiotics no longer work well against certain types of infections (antibiotic resistance).  You take medicines that irritate your urinary tract.  You are exposed to chemicals that irritate your urinary tract.  You are female.  What are the signs or symptoms? Symptoms of this condition include:  Fever.  Frequent urination or passing small amounts of urine frequently.  Needing to urinate urgently.  Pain or burning with urination.  Urine that smells bad or unusual.  Cloudy urine.  Pain in the lower abdomen or back.  Trouble urinating.  Blood in the urine.  Vomiting or being less hungry than normal.  Diarrhea or abdominal pain.  Vaginal discharge, if you are female.  How is this  diagnosed? This condition is diagnosed with a medical history and physical exam. You will also need to provide a urine sample to test your urine. Other tests may be done, including:  Blood tests.  Sexually transmitted disease (STD) testing.  If you have had more than one UTI, a cystoscopy or imaging studies may be done to determine the cause of the infections. How is this treated? Treatment for this condition often includes a combination of two or more of the following:  Antibiotic medicine.  Other medicines to treat less common causes of UTI.  Over-the-counter medicines to treat pain.  Drinking enough water to stay hydrated.  Follow these instructions at home:  Take over-the-counter and prescription medicines only as told by your health care provider.  If you were prescribed an antibiotic, take it as told by your health care provider. Do not stop taking the antibiotic even if you start to feel better.  Avoid alcohol, caffeine, tea, and carbonated beverages. They can irritate your bladder.  Drink enough fluid to keep your urine clear or pale yellow.  Keep all follow-up visits as told by your health care provider. This is important.  Make sure to: ? Empty your bladder often and completely. Do not hold urine for long periods of time. ? Empty your bladder before and after sex. ? Wipe from front to back after a bowel movement if you are female. Use each tissue one time when you wipe. Contact a health care provider if:  You have back pain.  You have a fever.  You feel nauseous or   vomit.  Your symptoms do not get better after 3 days.  Your symptoms go away and then return. Get help right away if:  You have severe back pain or lower abdominal pain.  You are vomiting and cannot keep down any medicines or water. This information is not intended to replace advice given to you by your health care provider. Make sure you discuss any questions you have with your health care  provider. Document Released: 12/09/2004 Document Revised: 08/13/2015 Document Reviewed: 01/20/2015 Elsevier Interactive Patient Education  2018 Elsevier Inc[employer]  

## 2017-09-16 LAB — URINE CULTURE

## 2017-12-30 ENCOUNTER — Ambulatory Visit: Payer: 59 | Admitting: Family Medicine

## 2017-12-30 ENCOUNTER — Encounter: Payer: Self-pay | Admitting: Family Medicine

## 2017-12-30 VITALS — BP 148/74 | HR 80 | Temp 100.1°F | Ht 63.0 in | Wt 236.8 lb

## 2017-12-30 DIAGNOSIS — J329 Chronic sinusitis, unspecified: Secondary | ICD-10-CM

## 2017-12-30 DIAGNOSIS — J40 Bronchitis, not specified as acute or chronic: Secondary | ICD-10-CM | POA: Diagnosis not present

## 2017-12-30 MED ORDER — AMOXICILLIN-POT CLAVULANATE 875-125 MG PO TABS: 1.0000 | ORAL_TABLET | Freq: Two times a day (BID) | ORAL | 0 refills | Status: DC

## 2017-12-30 NOTE — Progress Notes (Signed)
Chief Complaint  Patient presents with  . Cough    x wk, productive this morning, green phlegm  . Fever    x 3 days, 101 highest  . Back Pain    mid back pain  . Generalized Body Aches    HPI  Patient presents today for Patient presents with upper respiratory congestion. Rhinorrhea that is frequently purulent. There is moderate sore throat. Patient reports coughing frequently as well.  green sputum noted. There is no fever, chills or sweats. The patient denies being short of breath. Onset was 3-5 days ago. Gradually worsening. Tried OTCs without improvement.  PMH: Smoking status noted ROS: Per HPI  Objective: BP (!) 148/74   Pulse 80   Temp 100.1 F (37.8 C) (Oral)   Ht 5\' 3"  (1.6 m)   Wt 236 lb 12.8 oz (107.4 kg)   LMP 05/13/2013   BMI 41.95 kg/m  Gen: NAD, alert, cooperative with exam HEENT: NCAT, Nasal passages swollen, red TMS RED CV: RRR, good S1/S2, no murmur Resp: Bronchitis changes with scattered wheezes, non-labored Ext: No edema, warm Neuro: Alert and oriented, No gross deficits  Assessment and plan:  No diagnosis found.  Meds ordered this encounter  Medications  . amoxicillin-clavulanate (AUGMENTIN) 875-125 MG tablet    Sig: Take 1 tablet by mouth 2 (two) times daily. Take all of this medication    Dispense:  20 tablet    Refill:  0      Follow up as needed.  Mechele Claude, MD

## 2018-07-21 ENCOUNTER — Telehealth: Payer: Self-pay | Admitting: Family Medicine

## 2018-11-22 ENCOUNTER — Telehealth: Payer: Self-pay | Admitting: Physician Assistant

## 2018-11-23 ENCOUNTER — Other Ambulatory Visit: Payer: Self-pay

## 2018-11-23 ENCOUNTER — Encounter: Payer: Self-pay | Admitting: Physician Assistant

## 2018-11-23 ENCOUNTER — Ambulatory Visit: Payer: 59 | Admitting: Physician Assistant

## 2018-11-23 VITALS — BP 141/76 | HR 89 | Temp 97.8°F | Ht 63.0 in | Wt 249.2 lb

## 2018-11-23 DIAGNOSIS — R002 Palpitations: Secondary | ICD-10-CM

## 2018-11-23 DIAGNOSIS — F320 Major depressive disorder, single episode, mild: Secondary | ICD-10-CM | POA: Diagnosis not present

## 2018-11-23 DIAGNOSIS — R000 Tachycardia, unspecified: Secondary | ICD-10-CM

## 2018-11-23 MED ORDER — ESCITALOPRAM OXALATE 10 MG PO TABS: 10.0000 mg | ORAL_TABLET | Freq: Every day | ORAL | 5 refills | Status: DC

## 2018-11-23 NOTE — Progress Notes (Signed)
BP (!) 141/76   Pulse 89   Temp 97.8 F (36.6 C) (Temporal)   Ht 5\' 3"  (1.6 m)   Wt 249 lb 3.2 oz (113 kg)   LMP 05/13/2013   SpO2 99%   BMI 44.14 kg/m    Subjective:    Patient ID: Kendra Benitez, female    DOB: [birth year], 58 y.o.   MRN: [id number]  HPI: Kendra Benitez is a 58 y.o. female presenting on 11/23/2018 for Palpitations and Stress  Patient comes in after having several days of increasing episodic palpitations.  She can feel her chest pounding quicker and harder.  She has never had any significant issues like this.  She does drink some caffeine but has avoided a lot of debt.  She states that she does not have any chest pain, dyspnea, orthopnea, diaphoresis, radiating pain.  It will last for several moments at a time.  It does not really matter what she is doing for it to come on.  She will sometimes sit down and try to take slow deep breaths to have it to calm down.  She does have some risk factors for heart disease including hypertension, hyperlipidemia and obesity.  The patient reports that she is under a significant amount of stress related to several deaths in her family.  She knows that the stress might be adding to this.  But she was just concerned about a cardiac cause.  I think is very appropriate for [place] to have her go and see cardiology for a work-up given the above information.  But we are going to go ahead and start a very low-dose female of antidepressant to see if this can help calm down any depression and anxiety symptoms she is feeling.  We will plan to see her back in a few weeks.  Past Medical History:  Diagnosis Date  . Allergy   . GERD (gastroesophageal reflux disease)    history of   . Hyperlipidemia    Relevant past medical, surgical, family and social history reviewed and updated as indicated. Interim medical history since our last visit reviewed. Allergies and medications reviewed and updated. DATA REVIEWED: CHART IN EPIC  Family History reviewed  for pertinent findings.  Review of Systems  Constitutional: Negative.   HENT: Negative.   Eyes: Negative.   Respiratory: Negative.  Negative for chest tightness, shortness of breath and wheezing.   Cardiovascular: Positive for palpitations. Negative for chest pain and leg swelling.  Gastrointestinal: Negative.   Genitourinary: Negative.   Psychiatric/Behavioral: The patient is nervous/anxious.     Allergies as of 11/23/2018   No Known Allergies     Medication List       Accurate as of November 23, 2018 11:59 PM. If you have any questions, ask your nurse or doctor.        STOP taking these medications   amoxicillin-clavulanate 875-125 MG tablet Commonly known as: AUGMENTIN Stopped by: Terald Sleeper, PA-C     TAKE these medications   escitalopram 10 MG tablet Commonly known as: Lexapro Take 1 tablet (10 mg total) by mouth daily. Started by: Terald Sleeper, PA-C          Objective:    BP (!) 141/76   Pulse 89   Temp 97.8 F (36.6 C) (Temporal)   Ht 5\' 3"  (1.6 m)   Wt 249 lb 3.2 oz (113 kg)   LMP 05/13/2013   SpO2 99%   BMI 44.14 kg/m  No Known Allergies  Wt Readings from Last 3 Encounters:  11/23/18 249 lb 3.2 oz (113 kg)  12/30/17 236 lb 12.8 oz (107.4 kg)  09/13/17 246 lb 6.4 oz (111.8 kg)    Physical Exam Constitutional:      Appearance: She is well-developed.  HENT:     Head: Normocephalic and atraumatic.     Right Ear: Tympanic membrane, ear canal and external ear normal.     Left Ear: Tympanic membrane, ear canal and external ear normal.     Nose: Nose normal. No rhinorrhea.     Mouth/Throat:     Pharynx: No oropharyngeal exudate or posterior oropharyngeal erythema.  Eyes:     Conjunctiva/sclera: Conjunctivae normal.     Pupils: Pupils are equal, round, and reactive to light.  Neck:     Musculoskeletal: Normal range of motion and neck supple.  Cardiovascular:     Rate and Rhythm: Normal rate and regular rhythm.     Heart sounds: Normal  heart sounds.  Pulmonary:     Effort: Pulmonary effort is normal.     Breath sounds: Normal breath sounds.  Abdominal:     General: Bowel sounds are normal.     Palpations: Abdomen is soft.  Skin:    General: Skin is warm and dry.     Findings: No rash.  Neurological:     Mental Status: She is alert and oriented to person, place, and time.     Deep Tendon Reflexes: Reflexes are normal and symmetric.  Psychiatric:        Behavior: Behavior normal.        Thought Content: Thought content normal.        Judgment: Judgment normal.         Assessment & Plan:   1. Palpitations - EKG 12-Lead - Ambulatory referral to Cardiology  2. Tachycardia - EKG 12-Lead - Ambulatory referral to Cardiology  3. Depression, major, single episode, mild (HCC) - escitalopram (LEXAPRO) 10 MG tablet; Take 1 tablet (10 mg total) by mouth daily.  Dispense: 30 tablet; Refill: 5   Continue all other maintenance medications as listed above.  Follow up plan: Return in about 4 weeks (around 12/21/2018).  Educational handout given for tachycardia  Remus LofflerAngel S. Josean Lycan PA-C [facility] [address]  [place], [place] [id number] [phone]   11/26/2018, 9:32 PM

## 2018-12-25 ENCOUNTER — Encounter: Payer: Self-pay | Admitting: Physician Assistant

## 2018-12-25 ENCOUNTER — Ambulatory Visit: Payer: 59 | Admitting: Cardiovascular Disease

## 2018-12-25 ENCOUNTER — Ambulatory Visit: Payer: 59 | Admitting: Physician Assistant

## 2018-12-25 ENCOUNTER — Encounter: Payer: Self-pay | Admitting: Cardiovascular Disease

## 2018-12-25 ENCOUNTER — Other Ambulatory Visit: Payer: Self-pay

## 2018-12-25 VITALS — BP 143/80 | HR 80 | Ht 63.0 in | Wt 249.0 lb

## 2018-12-25 VITALS — BP 166/80 | HR 72 | Temp 97.8°F | Ht 63.0 in | Wt 248.8 lb

## 2018-12-25 DIAGNOSIS — R002 Palpitations: Secondary | ICD-10-CM

## 2018-12-25 DIAGNOSIS — F419 Anxiety disorder, unspecified: Secondary | ICD-10-CM | POA: Diagnosis not present

## 2018-12-25 DIAGNOSIS — F320 Major depressive disorder, single episode, mild: Secondary | ICD-10-CM | POA: Insufficient documentation

## 2018-12-25 MED ORDER — ESCITALOPRAM OXALATE 20 MG PO TABS: 10.0000 mg | ORAL_TABLET | Freq: Every day | ORAL | 5 refills | Status: DC

## 2018-12-25 NOTE — Progress Notes (Signed)
CARDIOLOGY CONSULT NOTE  Patient ID: Kendra Benitez MRN: [id number] DOB/AGE: Feb 10, 1961 58 y.o.  Admit date: (Not on file) Primary Physician: Terald Sleeper, PA-C  Reason for Consultation: Palpitations  HPI: Kendra Benitez is a 58 y.o. female who is being seen today for the evaluation of palpitations at the request of Terald Sleeper, PA-C.   I personally reviewed the ECG performed on 11/23/2018 which demonstrated sinus rhythm, 100 bpm.  In early September she had a lot of stress going on in her life and she experienced at least 2 days with significant palpitations.  Since that time she was put on Lexapro by her PCP and symptoms have significantly improved.  She denies chest pain, leg swelling, and shortness of breath.  She denies orthopnea and paroxysmal nocturnal dyspnea.  She has sleep apnea and uses CPAP nightly.  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  She has had a lot of stress going on lately with 4 recent deaths in the family.   No Known Allergies  Current Outpatient Medications  Medication Sig Dispense Refill  . escitalopram (LEXAPRO) 10 MG tablet Take 1 tablet (10 mg total) by mouth daily. 30 tablet 5   No current facility-administered medications for this visit.     Past Medical History:  Diagnosis Date  . Allergy   . GERD (gastroesophageal reflux disease)    history of   . Hyperlipidemia     Past Surgical History:  Procedure Laterality Date  . TONSILLECTOMY    . TUBAL LIGATION      Social History   Socioeconomic History  . Marital status: Married    Spouse name: Not on file  . Number of children: Not on file  . Years of education: Not on file  . Highest education level: Not on file  Occupational History  . Not on file  Social Needs  . Financial resource strain: Not on file  . Food insecurity    Worry: Not on file    Inability: Not on file  . Transportation needs    Medical: Not on file    Non-medical: Not on file   Tobacco Use  . Smoking status: Never Smoker  . Smokeless tobacco: Never Used  Substance and Sexual Activity  . Alcohol use: No  . Drug use: No  . Sexual activity: Not on file  Lifestyle  . Physical activity    Days per week: Not on file    Minutes per session: Not on file  . Stress: Not on file  Relationships  . Social Herbalist on phone: Not on file    Gets together: Not on file    Attends religious service: Not on file    Active member of club or organization: Not on file    Attends meetings of clubs or organizations: Not on file    Relationship status: Not on file  . Intimate partner violence    Fear of current or ex partner: Not on file    Emotionally abused: Not on file    Physically abused: Not on file    Forced sexual activity: Not on file  Other Topics Concern  . Not on file  Social History Narrative  . Not on file     No family history of premature CAD in 1st degree relatives.  Current Meds  Medication Sig  . escitalopram (LEXAPRO) 10 MG tablet Take 1 tablet (10 mg total) by  mouth daily.      Review of systems complete and found to be negative unless listed above in HPI    Physical exam Blood pressure (!) 143/80, pulse 80, height 5\' 3"  (1.6 m), weight 249 lb (112.9 kg), last menstrual period 05/13/2013, SpO2 100 %. General: NAD Neck: No JVD, no thyromegaly or thyroid nodule.  Lungs: Clear to auscultation bilaterally with normal respiratory effort. CV: Nondisplaced PMI. Regular rate and rhythm, normal S1/S2, no S3/S4, no murmur.  No peripheral edema.  No carotid bruit.    Abdomen: Soft, nontender, no distention.  Skin: Intact without lesions or rashes.  Neurologic: Alert and oriented x 3.  Psych: Normal affect. Extremities: No clubbing or cyanosis.  HEENT: Normal.   ECG: Most recent ECG reviewed.   Labs: Lab Results  Component Value Date/Time   K 4.6 09/22/2015 02:39 PM   BUN 10 09/22/2015 02:39 PM   CREATININE 0.84 09/22/2015 02:39  PM   TSH 3.420 09/22/2015 02:39 PM   HGB 13.3 09/22/2015 02:39 PM     Lipids: Lab Results  Component Value Date/Time   LDLCALC 126 (H) 09/22/2015 02:39 PM   CHOL 194 09/22/2015 02:39 PM   TRIG 102 09/22/2015 02:39 PM   HDL 48 09/22/2015 02:39 PM        ASSESSMENT AND PLAN:  1.  Palpitations: Symptoms have significantly improved with institution of Lexapro by PCP.  I suspect her symptoms were stress related.  She has had 4 recent deaths in the family with 2 this past weekend.  In spite of this her symptoms have still improved.  I do not feel any cardiac testing is warranted at this time.  I told her to contact me should she have symptom recurrence.     Disposition: Follow up prn  Signed: 11/23/2015, M.D., F.A.C.C.  12/25/2018, 10:18 AM

## 2018-12-25 NOTE — Progress Notes (Signed)
BP (!) 166/80   Pulse 72   Temp 97.8 F (36.6 C) (Temporal)   Ht 5\' 3"  (1.6 m)   Wt 248 lb 12.8 oz (112.9 kg)   LMP 05/13/2013   SpO2 97%   BMI 44.07 kg/m    Subjective:    Patient ID: Kendra Benitez, female    DOB: August 30, 1960, 58 y.o.   MRN: [id number]  HPI: Kendra Benitez is a 58 y.o. female presenting on 12/25/2018 for Depression  This patient comes in for 1 month recheck on her depression.  She is feeling much better with her Lexapro.  She is also having some anxiety.  She has had 4 deaths in the past few months.  And knows that this is aggravating things are.  She states that she feels very good on the medication.  Depression screen [facility] 2/9 12/25/2018 11/23/2018 06/29/2016 10/15/2015 09/17/2015  Decreased Interest 0 0 0 0 0  Down, Depressed, Hopeless 0 0 0 0 0  PHQ - 2 Score 0 0 0 0 0  Altered sleeping 0 - - - -  Tired, decreased energy 0 - - - -  Change in appetite 0 - - - -  Feeling bad or failure about yourself  0 - - - -  Trouble concentrating 0 - - - -  Moving slowly or fidgety/restless 0 - - - -  Suicidal thoughts 0 - - - -  PHQ-9 Score 0 - - - -   GAD 7 : Generalized Anxiety Score 12/25/2018 11/23/2018  Nervous, Anxious, on Edge 2 3  Control/stop worrying 0 1  Worry too much - different things 0 1  Trouble relaxing 0 0  Restless 0 0  Easily annoyed or irritable 3 3  Afraid - awful might happen 0 1  Total GAD 7 Score 5 9  Anxiety Difficulty Not difficult at all Not difficult at all                 Past Medical History:  Diagnosis Date  . Allergy   . GERD (gastroesophageal reflux disease)    history of   . Hyperlipidemia    Relevant past medical, surgical, family and social history reviewed and updated as indicated. Interim medical history since our last visit reviewed. Allergies and medications reviewed and updated. DATA REVIEWED: CHART IN EPIC  Family History reviewed for pertinent findings.  Review of Systems  Constitutional: Negative.   Negative for activity change, fatigue and fever.  HENT: Negative.   Eyes: Negative.   Respiratory: Negative.  Negative for cough.   Cardiovascular: Negative.  Negative for chest pain.  Gastrointestinal: Negative.  Negative for abdominal pain.  Endocrine: Negative.   Genitourinary: Negative.  Negative for dysuria.  Musculoskeletal: Negative.   Skin: Negative.   Neurological: Negative.     Allergies as of 12/25/2018   No Known Allergies     Medication List       Accurate as of December 25, 2018 11:59 PM. If you have any questions, ask your nurse or doctor.        escitalopram 20 MG tablet Commonly known as: Lexapro Take 0.5 tablets (10 mg total) by mouth daily. What changed: medication strength Changed by: December 27, 2018, PA-C          Objective:    BP (!) 166/80   Pulse 72   Temp 97.8 F (36.6 C) (Temporal)   Ht 5\' 3"  (1.6 m)   Wt 248 lb 12.8  oz (112.9 kg)   LMP 05/13/2013   SpO2 97%   BMI 44.07 kg/m   No Known Allergies  Wt Readings from Last 3 Encounters:  12/25/18 248 lb 12.8 oz (112.9 kg)  12/25/18 249 lb (112.9 kg)  11/23/18 249 lb 3.2 oz (113 kg)    Physical Exam Constitutional:      General: She is not in acute distress.    Appearance: Normal appearance. She is well-developed.  HENT:     Head: Normocephalic and atraumatic.  Cardiovascular:     Rate and Rhythm: Normal rate.  Pulmonary:     Effort: Pulmonary effort is normal.  Skin:    General: Skin is warm and dry.     Findings: No rash.  Neurological:     Mental Status: She is alert and oriented to person, place, and time.     Deep Tendon Reflexes: Reflexes are normal and symmetric.         Assessment & Plan:   1. Depression, major, single episode, mild (HCC) - escitalopram (LEXAPRO) 20 MG tablet; Take 0.5 tablets (10 mg total) by mouth daily.  Dispense: 30 tablet; Refill: 5  2. Anxiety - escitalopram (LEXAPRO) 20 MG tablet; Take 0.5 tablets (10 mg total) by mouth daily.  Dispense:  30 tablet; Refill: 5   Continue all other maintenance medications as listed above.  Follow up plan: Return in about 6 months (around 06/25/2019).  Educational handout given for anxiety  Terald Sleeper PA-C [facility] [address]  [place], [place] [address] [phone]   12/28/2018, 8:03 PM

## 2018-12-25 NOTE — Patient Instructions (Signed)
Medication Instructions:  Continue all current medications.  Labwork: none  Testing/Procedures: none  Follow-Up: As needed.    Any Other Special Instructions Will Be Listed Below (If Applicable).  If you need a refill on your cardiac medications before your next appointment, please call your pharmacy.  

## 2018-12-25 NOTE — Patient Instructions (Signed)
Managing Loss, Adult People experience loss in many different ways throughout their lives. Events such as moving, changing jobs, and losing friends can create a sense of loss. The loss may be as serious as a major health change, divorce, death of a pet, or death of a loved one. All of these types of loss are likely to create a physical and emotional reaction known as grief. Grief is the result of a major change or an absence of something or someone that you count on. Grief is a normal reaction to loss. A variety of factors can affect your grieving experience, including:  The nature of your loss.  Your relationship to what or whom you lost.  Your understanding of grief and how to manage it.  Your support system. How to manage lifestyle changes Keep to your normal routine as much as possible.  If you have trouble focusing or doing normal activities, it is acceptable to take some time away from your normal routine.  Spend time with friends and loved ones.  Eat a healthy diet, get plenty of sleep, and rest when you feel tired. How to recognize changes  The way that you deal with your grief will affect your ability to function as you normally do. When grieving, you may experience these changes:  Numbness, shock, sadness, anxiety, anger, denial, and guilt.  Thoughts about death.  Unexpected crying.  A physical sensation of emptiness in your stomach.  Problems sleeping and eating.  Tiredness (fatigue).  Loss of interest in normal activities.  Dreaming about or imagining seeing the person who died.  A need to remember what or whom you lost.  Difficulty thinking about anything other than your loss for a period of time.  Relief. If you have been expecting the loss for a while, you may feel a sense of relief when it happens. Follow these instructions at home:  Activity Express your feelings in healthy ways, such as:  Talking with others about your loss. It may be helpful to find  others who have had a similar loss, such as a support group.  Writing down your feelings in a journal.  Doing physical activities to release stress and emotional energy.  Doing creative activities like painting, sculpting, or playing or listening to music.  Practicing resilience. This is the ability to recover and adjust after facing challenges. Reading some resources that encourage resilience may help you to learn ways to practice those behaviors. General instructions  Be patient with yourself and others. Allow the grieving process to happen, and remember that grieving takes time. ? It is likely that you may never feel completely done with some grief. You may find a way to move on while still cherishing memories and feelings about your loss. ? Accepting your loss is a process. It can take months or longer to adjust.  Keep all follow-up visits as told by your health care provider. This is important. Where to find support To get support for managing loss:  Ask your health care provider for help and recommendations, such as grief counseling or therapy.  Think about joining a support group for people who are managing a loss. Where to find more information You can find more information about managing loss from:  American Society of Clinical Oncology: www.cancer.net  American Psychological Association: www.apa.org Contact a health care provider if:  Your grief is extreme and keeps getting worse.  You have ongoing grief that does not improve.  Your body shows symptoms of grief, such   as illness.  You feel depressed, anxious, or lonely. Get help right away if:  You have thoughts about hurting yourself or others. If you ever feel like you may hurt yourself or others, or have thoughts about taking your own life, get help right away. You can go to your nearest emergency department or call:  Your local emergency services (911 in the [place]  A suicide crisis helpline, such as the  National Suicide Prevention Lifeline at 1-800-273-8255. This is open 24 hours a day. Summary  Grief is the result of a major change or an absence of someone or something that you count on. Grief is a normal reaction to loss.  The depth of grief and the period of recovery depend on the type of loss and your ability to adjust to the change and process your feelings.  Processing grief requires patience and a willingness to accept your feelings and talk about your loss with people who are supportive.  It is important to find resources that work for you and to realize that people experience grief differently. There is not one grieving process that works for everyone in the same way.  Be aware that when grief becomes extreme, it can lead to more severe issues like isolation, depression, anxiety, or suicidal thoughts. Talk with your health care provider if you have any of these issues. This information is not intended to replace advice given to you by your health care provider. Make sure you discuss any questions you have with your health care provider. Document Released: 07/15/2016 Document Revised: 05/05/2018 Document Reviewed: 07/15/2016 Elsevier Patient Education  2020 Elsevier Inc.  

## 2019-06-25 ENCOUNTER — Ambulatory Visit: Payer: 59 | Admitting: Physician Assistant

## 2019-06-25 ENCOUNTER — Ambulatory Visit: Payer: 59 | Admitting: Family

## 2019-06-27 ENCOUNTER — Ambulatory Visit: Payer: 59 | Admitting: Family Medicine

## 2019-06-27 ENCOUNTER — Other Ambulatory Visit: Payer: Self-pay

## 2019-06-27 ENCOUNTER — Encounter: Payer: Self-pay | Admitting: Family Medicine

## 2019-06-27 VITALS — BP 119/71 | HR 88 | Temp 97.7°F | Ht 63.0 in | Wt 244.4 lb

## 2019-06-27 DIAGNOSIS — M67441 Ganglion, right hand: Secondary | ICD-10-CM

## 2019-06-27 DIAGNOSIS — L304 Erythema intertrigo: Secondary | ICD-10-CM | POA: Diagnosis not present

## 2019-06-27 DIAGNOSIS — Z7689 Persons encountering health services in other specified circumstances: Secondary | ICD-10-CM

## 2019-06-27 MED ORDER — NYSTATIN 100000 UNIT/GM EX POWD: 1.0000 "application " | Freq: Three times a day (TID) | CUTANEOUS | 1 refills | Status: DC

## 2019-06-27 NOTE — Patient Instructions (Signed)
Schedule your mammogram at checkout  Schedule physical with pap smear for the summer.  Come fasting so we can do labs   Intertrigo Intertrigo is skin irritation or inflammation (dermatitis) that occurs when folds of skin rub together. The irritation can cause a rash and make skin raw and itchy. This condition most commonly occurs in the skin folds of these areas:  Toes.  Armpits.  Groin.  Under the belly.  Under the breasts.  Buttocks. Intertrigo is not passed from person to person (is not contagious). What are the causes? This condition is caused by heat, moisture, rubbing (friction), and not enough air circulation. The condition can be made worse by:  Sweat.  Bacteria.  A fungus, such as yeast. What increases the risk? This condition is more likely to occur if you have moisture in your skin folds. You are more likely to develop this condition if you:  Have diabetes.  Are overweight.  Are not able to move around or are not active.  Live in a warm and moist climate.  Wear splints, braces, or other medical devices.  Are not able to control your bowels or bladder (have incontinence). What are the signs or symptoms? Symptoms of this condition include:  A pink or red skin rash in the skin fold or near the skin fold.  Raw or scaly skin.  Itchiness.  A burning feeling.  Bleeding.  Leaking fluid.  A bad smell. How is this diagnosed? This condition is diagnosed with a medical history and physical exam. You may also have a skin swab to test for bacteria or a fungus. How is this treated? This condition may be treated by:  Cleaning and drying your skin.  Taking an antibiotic medicine or using an antibiotic skin cream for a bacterial infection.  Using an antifungal cream on your skin or taking pills for an infection that was caused by a fungus, such as yeast.  Using a steroid ointment to relieve itchiness and irritation.  Separating the skin fold with a  clean cotton cloth to absorb moisture and allow air to flow into the area. Follow these instructions at home:  Keep the affected area clean and dry.  Do not scratch your skin.  Stay in a cool environment as much as possible. Use an air conditioner or fan, if available.  Apply over-the-counter and prescription medicines only as told by your health care provider.  If you were prescribed an antibiotic medicine, use it as told by your health care provider. Do not stop using the antibiotic even if your condition improves.  Keep all follow-up visits as told by your health care provider. This is important. How is this prevented?   Maintain a healthy weight.  Take care of your feet, especially if you have diabetes. Foot care includes: ? Wearing shoes that fit well. ? Keeping your feet dry. ? Wearing clean, breathable socks.  Protect the skin around your groin and buttocks, especially if you have incontinence. Skin protection includes: ? Following a regular cleaning routine. ? Using skin protectant creams, powders, or ointments. ? Changing protection pads frequently.  Do not wear tight clothes. Wear clothes that are loose, absorbent, and made of cotton.  Wear a bra that gives good support, if needed.  Shower and dry yourself well after activity or exercise. Use a hair dryer on a cool setting to dry between skin folds, especially after you bathe.  If you have diabetes, keep your blood sugar under control. Contact a health care  provider if:  Your symptoms do not improve with treatment.  Your symptoms get worse or they spread.  You notice increased redness and warmth.  You have a fever. Summary  Intertrigo is skin irritation or inflammation (dermatitis) that occurs when folds of skin rub together.  This condition is caused by heat, moisture, rubbing (friction), and not enough air circulation.  This condition may be treated by cleaning and drying your skin and with  medicines.  Apply over-the-counter and prescription medicines only as told by your health care provider.  Keep all follow-up visits as told by your health care provider. This is important. This information is not intended to replace advice given to you by your health care provider. Make sure you discuss any questions you have with your health care provider. Document Revised: 08/01/2017 Document Reviewed: 08/01/2017 Elsevier Patient Education  2020 Elsevier Inc.   Ganglion Cyst  A ganglion cyst is a non-cancerous, fluid-filled lump that occurs near a joint or tendon. The cyst grows out of a joint or the lining of a tendon. Ganglion cysts most often develop in the hand or wrist, but they can also develop in the shoulder, elbow, hip, knee, ankle, or foot. Ganglion cysts are ball-shaped or egg-shaped. Their size can range from the size of a pea to larger than a grape. Increased activity may cause the cyst to get bigger because more fluid starts to build up. What are the causes? The exact cause of this condition is not known, but it may be related to:  Inflammation or irritation around the joint.  An injury.  Repetitive movements or overuse.  Arthritis. What increases the risk? You are more likely to develop this condition if:  You are a woman.  You are 44-71 years old. What are the signs or symptoms? The main symptom of this condition is a lump. It most often appears on the hand or wrist. In many cases, there are no other symptoms, but a cyst can sometimes cause:  Tingling.  Pain.  Numbness.  Muscle weakness.  Weak grip.  Less range of motion in a joint. How is this diagnosed? Ganglion cysts are usually diagnosed based on a physical exam. Your health care provider will feel the lump and may shine a light next to it. If it is a ganglion cyst, the light will likely shine through it. Your health care provider may order an X-ray, ultrasound, or MRI to rule out other  conditions. How is this treated? Ganglion cysts often go away on their own without treatment. If you have pain or other symptoms, treatment may be needed. Treatment is also needed if the ganglion cyst limits your movement or if it gets infected. Treatment may include:  Wearing a brace or splint on your wrist or finger.  Taking anti-inflammatory medicine.  Having fluid drained from the lump with a needle (aspiration).  Getting a steroid injected into the joint.  Having surgery to remove the ganglion cyst.  Placing a pad on your shoe or wearing shoes that will not rub against the cyst if it is on your foot. Follow these instructions at home:  Do not press on the ganglion cyst, poke it with a needle, or hit it.  Take over-the-counter and prescription medicines only as told by your health care provider.  If you have a brace or splint: ? Wear it as told by your health care provider. ? Remove it as told by your health care provider. Ask if you need to remove it  when you take a shower or a bath.  Watch your ganglion cyst for any changes.  Keep all follow-up visits as told by your health care provider. This is important. Contact a health care provider if:  Your ganglion cyst becomes larger or more painful.  You have pus coming from the lump.  You have weakness or numbness in the affected area.  You have a fever or chills. Get help right away if:  You have a fever and have any of these in the cyst area: ? Increased redness. ? Red streaks. ? Swelling. Summary  A ganglion cyst is a non-cancerous, fluid-filled lump that occurs near a joint or tendon.  Ganglion cysts most often develop in the hand or wrist, but they can also develop in the shoulder, elbow, hip, knee, ankle, or foot.  Ganglion cysts often go away on their own without treatment. This information is not intended to replace advice given to you by your health care provider. Make sure you discuss any questions you have  with your health care provider. Document Revised: 02/11/2017 Document Reviewed: 10/29/2016 Elsevier Patient Education  [place].

## 2019-06-27 NOTE — Progress Notes (Signed)
Subjective: CC: Establish care, finger lesion, groin rash PCP: Remus Loffler, PA-C ZES:PQZRA P Kendra Benitez is a 59 y.o. female presenting to clinic today for:  1.  Finger lesion Patient reports that she had previously been evaluated for right middle finger lesion.  She denies any pain, sensation changes, decreased ability to move her hands.  She works for the school system.  She is interested in getting this removed because of cosmetic purposes.  Would like referral today.  2.  Groin rash Patient reports intermittent groin rash that occurs on the left side.  She notes that she had some vesicular formation that easily burst when she would rub it.  She notes that this was an itchy rash.  Denies any overt exudates, skin breakdown, bleeding, pain.  No treatment thus far.   ROS: Per HPI  No Known Allergies Past Medical History:  Diagnosis Date  . Allergy   . GERD (gastroesophageal reflux disease)    history of   . Hyperlipidemia     Current Outpatient Medications:  .  escitalopram (LEXAPRO) 20 MG tablet, Take 0.5 tablets (10 mg total) by mouth daily., Disp: 30 tablet, Rfl: 5 Social History   Socioeconomic History  . Marital status: Married    Spouse name: Not on file  . Number of children: Not on file  . Years of education: Not on file  . Highest education level: Not on file  Occupational History  . Not on file  Tobacco Use  . Smoking status: Never Smoker  . Smokeless tobacco: Never Used  Substance and Sexual Activity  . Alcohol use: No  . Drug use: No  . Sexual activity: Not on file  Other Topics Concern  . Not on file  Social History Narrative  . Not on file   Social Determinants of Health   Financial Resource Strain:   . Difficulty of Paying Living Expenses:   Food Insecurity:   . Worried About Programme researcher, broadcasting/film/video in the Last Year:   . Barista in the Last Year:   Transportation Needs:   . Freight forwarder (Medical):   Marland Kitchen Lack of Transportation  (Non-Medical):   Physical Activity:   . Days of Exercise per Week:   . Minutes of Exercise per Session:   Stress:   . Feeling of Stress :   Social Connections:   . Frequency of Communication with Friends and Family:   . Frequency of Social Gatherings with Friends and Family:   . Attends Religious Services:   . Active Member of Clubs or Organizations:   . Attends Banker Meetings:   Marland Kitchen Marital Status:   Intimate Partner Violence:   . Fear of Current or Ex-Partner:   . Emotionally Abused:   Marland Kitchen Physically Abused:   . Sexually Abused:    Family History  Problem Relation Age of Onset  . Hypertension Mother   . Hypertension Father   . Diabetes Father   . Colon cancer Neg Hx   . Colon polyps Neg Hx   . Rectal cancer Neg Hx   . Stomach cancer Neg Hx     Objective: Office vital signs reviewed. BP 119/71   Pulse 88   Temp 97.7 F (36.5 C)   Ht 5\' 3"  (1.6 m)   Wt 244 lb 6.4 oz (110.9 kg)   LMP 05/13/2013   SpO2 97%   BMI 43.29 kg/m   Physical Examination:  General: Awake, alert, well nourished, No acute  distress HEENT: Normal, sclera white, MMM Cardio: regular rate and rhythm, S1S2 heard, no murmurs appreciated Pulm: clear to auscultation bilaterally, no wheezes, rhonchi or rales; normal work of breathing on room air Extremities: warm, well perfused, No edema, cyanosis or clubbing; +2 pulses bilaterally MSK: normal gait and station  Right hand: Dorsum of the DIP of the third digit with a pea-sized lesion that is consistent with a ganglion cyst.  No tenderness to palpation.  Range of motion is preserved. Neuro: Light touch and station grossly intact  Assessment/ Plan: 59 y.o. female   1. Ganglion cyst of finger of right hand Very suspicious for ganglion cyst.  We discussed possible treatments including aspiration versus surgical resection.  I will place referral to orthopedics per her request for further evaluation and management.  2. Establishing care with  new doctor, encounter for  3. Intertrigo Nystatin powder provided to use as needed flares.  Advised to keep area clean and dry.  Handout provided. - nystatin (MYCOSTATIN/NYSTOP) powder; Apply 1 application topically 3 (three) times daily. (IF NEEDED for groin rash)  Dispense: 15 g; Refill: 1   Orders Placed This Encounter  Procedures  . Ambulatory referral to Orthopedic Surgery    Referral Priority:   Routine    Referral Type:   Surgical    Referral Reason:   Specialty Services Required    Requested Specialty:   Orthopedic Surgery    Number of Visits Requested:   1   Meds ordered this encounter  Medications  . nystatin (MYCOSTATIN/NYSTOP) powder    Sig: Apply 1 application topically 3 (three) times daily. (IF NEEDED for groin rash)    Dispense:  15 g    Refill:  1    She will schedule an appointment for full physical exam with fasting labs this summer.  She will schedule her mammogram at checkout today.  Janora Norlander, DO [place] [phone]

## 2019-07-10 ENCOUNTER — Ambulatory Visit: Payer: 59 | Admitting: Family

## 2019-09-28 ENCOUNTER — Ambulatory Visit: Payer: 59 | Admitting: Family Medicine

## 2019-10-30 ENCOUNTER — Ambulatory Visit: Payer: 59 | Admitting: Family Medicine

## 2019-12-19 ENCOUNTER — Encounter: Payer: Self-pay | Admitting: Nurse Practitioner

## 2019-12-19 ENCOUNTER — Ambulatory Visit: Payer: 59 | Admitting: Nurse Practitioner

## 2019-12-19 ENCOUNTER — Other Ambulatory Visit: Payer: Self-pay

## 2019-12-19 VITALS — BP 170/92 | HR 89 | Temp 97.2°F | Resp 20 | Ht 63.0 in | Wt 240.0 lb

## 2019-12-19 DIAGNOSIS — I10 Essential (primary) hypertension: Secondary | ICD-10-CM

## 2019-12-19 DIAGNOSIS — W19XXXA Unspecified fall, initial encounter: Secondary | ICD-10-CM | POA: Diagnosis not present

## 2019-12-19 NOTE — Progress Notes (Signed)
Subjective:    Patient ID: Kendra Benitez, female    DOB: 10-Jan-1961, 59 y.o.   MRN: [id number]   Chief Complaint: Kendra Benitez out today and fell   HPI Pt presents today after blacking out and falling at work. States she turned around to turn on a fan at work today and next thing she knew she was on the floor. She does not remember falling. States it was hot in the area. She works at [employer] in [employer]. No previous incidences in the past. No one observed the incident so she is not aware of how long she blacked out. Denies dizziness prior to the incident. Does state that she has had some incidences over the last 2 days where she has felt like she was off balance and going to fall and has had to hold on to something. Does not believe she hurt herself in the fall but is sore on the palm of her right hand and has a bruise on her right leg that is tender. Denies chest pain, SOB, headaches.   BP Readings from Last 3 Encounters:  12/19/19 (!) 170/92  06/27/19 119/71  12/25/18 (!) 166/80     Review of Systems  Constitutional: Negative.   HENT: Negative.   Eyes: Negative.   Respiratory: Negative.   Cardiovascular: Positive for leg swelling.  Gastrointestinal: Negative.   Endocrine: Negative.   Genitourinary: Negative.   Musculoskeletal: Negative.   Skin: Negative.   Neurological: Positive for syncope and light-headedness.  Psychiatric/Behavioral: Negative.      Objective:   Physical Exam Vitals and nursing note reviewed.  Constitutional:      Appearance: She is obese.  HENT:     Head: Normocephalic.     Right Ear: Tympanic membrane normal.     Left Ear: Tympanic membrane normal.     Nose: Nose normal.     Mouth/Throat:     Mouth: Mucous membranes are moist.     Pharynx: Oropharynx is clear.  Eyes:     Conjunctiva/sclera: Conjunctivae normal.  Cardiovascular:     Rate and Rhythm: Normal rate and regular rhythm.     Pulses: Normal pulses.     Heart sounds:  Normal heart sounds.  Pulmonary:     Effort: Pulmonary effort is normal.     Breath sounds: Normal breath sounds.  Abdominal:     General: Bowel sounds are normal.     Palpations: Abdomen is soft.  Musculoskeletal:     Cervical back: Normal range of motion and neck supple.     Right lower leg: 1+ Edema present.     Left lower leg: 1+ Edema present.  Skin:    Capillary Refill: Capillary refill takes 2 to 3 seconds.     Findings: Bruising (right calf) present.  Neurological:     Mental Status: She is alert and oriented to person, place, and time.  Psychiatric:        Mood and Affect: Mood normal.        Behavior: Behavior normal.    BP (!) 170/92 (BP Location: Right Arm, Cuff Size: Large)   Pulse 89   Temp (!) 97.2 F (36.2 C) (Temporal)   Resp 20   Ht 5\' 3"  (1.6 m)   Wt 240 lb (108.9 kg)   LMP 05/13/2013   SpO2 97%   BMI 42.51 kg/m      Assessment & Plan:  Kendra Benitez comes in today with chief complaint of Blacked out  today and fell   Diagnosis and orders addressed:  1. Essential hypertension Start lisinopril 20 mg daily Keep diary of blood pressures and bring to next appointment  2. Fall, initial encounter Follow up next week for complete physical with labwork   Labs pending Health Maintenance reviewed Diet and exercise encouraged  Follow up plan: 1 week   Mary-Margaret Daphine Deutscher, FNP

## 2019-12-19 NOTE — Patient Instructions (Signed)
DASH Eating Plan DASH stands for "Dietary Approaches to Stop Hypertension." The DASH eating plan is a healthy eating plan that has been shown to reduce high blood pressure (hypertension). It may also reduce your risk for type 2 diabetes, heart disease, and stroke. The DASH eating plan may also help with weight loss. What are tips for following this plan?  General guidelines  Avoid eating more than 2,300 mg (milligrams) of salt (sodium) a day. If you have hypertension, you may need to reduce your sodium intake to 1,500 mg a day.  Limit alcohol intake to no more than 1 drink a day for nonpregnant women and 2 drinks a day for men. One drink equals 12 oz of beer, 5 oz of wine, or 1 oz of hard liquor.  Work with your health care provider to maintain a healthy body weight or to lose weight. Ask what an ideal weight is for you.  Get at least 30 minutes of exercise that causes your heart to beat faster (aerobic exercise) most days of the week. Activities may include walking, swimming, or biking.  Work with your health care provider or diet and nutrition specialist (dietitian) to adjust your eating plan to your individual calorie needs. Reading food labels   Check food labels for the amount of sodium per serving. Choose foods with less than 5 percent of the Daily Value of sodium. Generally, foods with less than 300 mg of sodium per serving fit into this eating plan.  To find whole grains, look for the word "whole" as the first word in the ingredient list. Shopping  Buy products labeled as "low-sodium" or "no salt added."  Buy fresh foods. Avoid canned foods and premade or frozen meals. Cooking  Avoid adding salt when cooking. Use salt-free seasonings or herbs instead of table salt or sea salt. Check with your health care provider or pharmacist before using salt substitutes.  Do not fry foods. Cook foods using healthy methods such as baking, boiling, grilling, and broiling instead.  Cook with  heart-healthy oils, such as olive, canola, soybean, or sunflower oil. Meal planning  Eat a balanced diet that includes: ? 5 or more servings of fruits and vegetables each day. At each meal, try to fill half of your plate with fruits and vegetables. ? Up to 6-8 servings of whole grains each day. ? Less than 6 oz of lean meat, poultry, or fish each day. A 3-oz serving of meat is about the same size as a deck of cards. One egg equals 1 oz. ? 2 servings of low-fat dairy each day. ? A serving of nuts, seeds, or beans 5 times each week. ? Heart-healthy fats. Healthy fats called Omega-3 fatty acids are found in foods such as flaxseeds and coldwater fish, like sardines, salmon, and mackerel.  Limit how much you eat of the following: ? Canned or prepackaged foods. ? Food that is high in trans fat, such as fried foods. ? Food that is high in saturated fat, such as fatty meat. ? Sweets, desserts, sugary drinks, and other foods with added sugar. ? Full-fat dairy products.  Do not salt foods before eating.  Try to eat at least 2 vegetarian meals each week.  Eat more home-cooked food and less restaurant, buffet, and fast food.  When eating at a restaurant, ask that your food be prepared with less salt or no salt, if possible. What foods are recommended? The items listed may not be a complete list. Talk with your dietitian about   what dietary choices are best for you. Grains Whole-grain or whole-wheat bread. Whole-grain or whole-wheat pasta. Brown rice. Oatmeal. Quinoa. Bulgur. Whole-grain and low-sodium cereals. Pita bread. Low-fat, low-sodium crackers. Whole-wheat flour tortillas. Vegetables Fresh or frozen vegetables (raw, steamed, roasted, or grilled). Low-sodium or reduced-sodium tomato and vegetable juice. Low-sodium or reduced-sodium tomato sauce and tomato paste. Low-sodium or reduced-sodium canned vegetables. Fruits All fresh, dried, or frozen fruit. Canned fruit in natural juice (without  added sugar). Meat and other protein foods Skinless chicken or turkey. Ground chicken or turkey. Pork with fat trimmed off. Fish and seafood. Egg whites. Dried beans, peas, or lentils. Unsalted nuts, nut butters, and seeds. Unsalted canned beans. Lean cuts of beef with fat trimmed off. Low-sodium, lean deli meat. Dairy Low-fat (1%) or fat-free (skim) milk. Fat-free, low-fat, or reduced-fat cheeses. Nonfat, low-sodium ricotta or cottage cheese. Low-fat or nonfat yogurt. Low-fat, low-sodium cheese. Fats and oils Soft margarine without trans fats. Vegetable oil. Low-fat, reduced-fat, or light mayonnaise and salad dressings (reduced-sodium). Canola, safflower, olive, soybean, and sunflower oils. Avocado. Seasoning and other foods Herbs. Spices. Seasoning mixes without salt. Unsalted popcorn and pretzels. Fat-free sweets. What foods are not recommended? The items listed may not be a complete list. Talk with your dietitian about what dietary choices are best for you. Grains Baked goods made with fat, such as croissants, muffins, or some breads. Dry pasta or rice meal packs. Vegetables Creamed or fried vegetables. Vegetables in a cheese sauce. Regular canned vegetables (not low-sodium or reduced-sodium). Regular canned tomato sauce and paste (not low-sodium or reduced-sodium). Regular tomato and vegetable juice (not low-sodium or reduced-sodium). Pickles. Olives. Fruits Canned fruit in a light or heavy syrup. Fried fruit. Fruit in cream or butter sauce. Meat and other protein foods Fatty cuts of meat. Ribs. Fried meat. Bacon. Sausage. Bologna and other processed lunch meats. Salami. Fatback. Hotdogs. Bratwurst. Salted nuts and seeds. Canned beans with added salt. Canned or smoked fish. Whole eggs or egg yolks. Chicken or turkey with skin. Dairy Whole or 2% milk, cream, and half-and-half. Whole or full-fat cream cheese. Whole-fat or sweetened yogurt. Full-fat cheese. Nondairy creamers. Whipped toppings.  Processed cheese and cheese spreads. Fats and oils Butter. Stick margarine. Lard. Shortening. Ghee. Bacon fat. Tropical oils, such as coconut, palm kernel, or palm oil. Seasoning and other foods Salted popcorn and pretzels. Onion salt, garlic salt, seasoned salt, table salt, and sea salt. Worcestershire sauce. Tartar sauce. Barbecue sauce. Teriyaki sauce. Soy sauce, including reduced-sodium. Steak sauce. Canned and packaged gravies. Fish sauce. Oyster sauce. Cocktail sauce. Horseradish that you find on the shelf. Ketchup. Mustard. Meat flavorings and tenderizers. Bouillon cubes. Hot sauce and Tabasco sauce. Premade or packaged marinades. Premade or packaged taco seasonings. Relishes. Regular salad dressings. Where to find more information:  National Heart, Lung, and Blood Institute: www.nhlbi.nih.gov  American Heart Association: www.heart.org Summary  The DASH eating plan is a healthy eating plan that has been shown to reduce high blood pressure (hypertension). It may also reduce your risk for type 2 diabetes, heart disease, and stroke.  With the DASH eating plan, you should limit salt (sodium) intake to 2,300 mg a day. If you have hypertension, you may need to reduce your sodium intake to 1,500 mg a day.  When on the DASH eating plan, aim to eat more fresh fruits and vegetables, whole grains, lean proteins, low-fat dairy, and heart-healthy fats.  Work with your health care provider or diet and nutrition specialist (dietitian) to adjust your eating plan to your   individual calorie needs. This information is not intended to replace advice given to you by your health care provider. Make sure you discuss any questions you have with your health care provider. Document Revised: 02/11/2017 Document Reviewed: 02/23/2016 Elsevier Patient Education  2020 [employer]  

## 2019-12-20 ENCOUNTER — Telehealth: Payer: Self-pay

## 2019-12-20 MED ORDER — LISINOPRIL 20 MG PO TABS: 20.0000 mg | ORAL_TABLET | Freq: Every day | ORAL | 1 refills | Status: DC

## 2019-12-20 NOTE — Telephone Encounter (Signed)
Lisinopril rx sent to pharmacy.

## 2019-12-27 ENCOUNTER — Encounter: Payer: Self-pay | Admitting: Nurse Practitioner

## 2019-12-27 ENCOUNTER — Other Ambulatory Visit: Payer: Self-pay

## 2019-12-27 ENCOUNTER — Ambulatory Visit: Payer: 59 | Admitting: Nurse Practitioner

## 2019-12-27 VITALS — BP 169/83 | HR 75 | Temp 98.3°F | Ht 63.0 in | Wt 243.2 lb

## 2019-12-27 DIAGNOSIS — I10 Essential (primary) hypertension: Secondary | ICD-10-CM

## 2019-12-27 NOTE — Progress Notes (Signed)
Established Patient Office Visit  Subjective:  Patient ID: Kendra Benitez, female    DOB: 1960-05-30  Age: 59 y.o. MRN: [id number]  CC:  Chief Complaint  Patient presents with  . Fall    follow up   . Hypertension    HPI Kendra Benitez presents for Pt presents for follow up of hypertension. Patient was diagnosed in _8/04/2015. The patient is tolerating the medication well without side effects. Compliance with treatment has been good; including taking medication as directed , maintains a healthy diet and regular exercise regimen , and following up as directed.  Past Medical History:  Diagnosis Date  . Allergy   . GERD (gastroesophageal reflux disease)    history of   . Hyperlipidemia     Past Surgical History:  Procedure Laterality Date  . TONSILLECTOMY    . TUBAL LIGATION      Family History  Problem Relation Age of Onset  . Hypertension Mother   . Hypertension Father   . Diabetes Father   . Colon cancer Neg Hx   . Colon polyps Neg Hx   . Rectal cancer Neg Hx   . Stomach cancer Neg Hx                                                                            Outpatient Medications Prior to Visit  Medication Sig Dispense Refill  . lisinopril (ZESTRIL) 20 MG tablet Take 1 tablet (20 mg total) by mouth daily. 90 tablet 1   No facility-administered medications prior to visit.   ROS Review of Systems  Constitutional: Negative.   HENT: Negative.   Eyes: Negative.   Respiratory: Negative.   Cardiovascular: Negative.   Endocrine: Negative.   Genitourinary: Negative.   Musculoskeletal: Negative.   Skin: Negative.   Neurological: Negative for dizziness and headaches.  All other systems reviewed and are negative.     Objective:    Physical Exam Vitals reviewed.  Constitutional:      Appearance: Normal appearance.  HENT:     Head: Normocephalic.  Eyes:     Conjunctiva/sclera: Conjunctivae normal.  Cardiovascular:     Rate and  Rhythm: Normal rate and regular rhythm.     Pulses: Normal pulses.     Heart sounds: Normal heart sounds.  Pulmonary:     Effort: Pulmonary effort is normal.     Breath sounds: Normal breath sounds.  Abdominal:     General: Bowel sounds are normal.  Musculoskeletal:        General: Normal range of motion.  Skin:    General: Skin is warm.  Neurological:     Mental Status: She is alert and oriented to person, place, and time.  Psychiatric:        Mood and Affect: Mood normal.        Behavior: Behavior normal.     BP (!) 169/83   Pulse 75   Temp 98.3 F (36.8 C) (Temporal)   Ht 5\' 3"  (1.6 m)   Wt 243 lb 3.2 oz (110.3 kg)   LMP 05/13/2013   SpO2 100%   BMI 43.08 kg/m  Wt Readings from Last 3 Encounters:  12/27/19 243 lb 3.2 oz (  110.3 kg)  12/19/19 240 lb (108.9 kg)  06/27/19 244 lb 6.4 oz (110.9 kg)     Health Maintenance Due  Topic Date Due  . Hepatitis C Screening  Never done  . COVID-19 Vaccine (1) Never done  . HIV Screening  Never done  . TETANUS/TDAP  Never done  . MAMMOGRAM  12/07/2017  . PAP SMEAR-Modifier  09/17/2018  . INFLUENZA VACCINE  Never done     Lab Results  Component Value Date   TSH 3.420 09/22/2015   Lab Results  Component Value Date   WBC 5.0 09/22/2015   HGB 13.3 09/22/2015   HCT 42.6 09/22/2015   MCV 88 09/22/2015   PLT 277 09/22/2015   Lab Results  Component Value Date   NA 143 09/22/2015   K 4.6 09/22/2015   CO2 24 09/22/2015   GLUCOSE 89 09/22/2015   BUN 10 09/22/2015   CREATININE 0.84 09/22/2015   CALCIUM 9.5 09/22/2015   Lab Results  Component Value Date   CHOL 194 09/22/2015   Lab Results  Component Value Date   HDL 48 09/22/2015   Lab Results  Component Value Date   LDLCALC 126 (H) 09/22/2015   Lab Results  Component Value Date   TRIG 102 09/22/2015   Lab Results  Component Value Date   CHOLHDL 4.0 09/22/2015      Assessment & Plan:   Problem List Items Addressed This Visit      Cardiovascular  and Mediastinum   Essential hypertension - Primary    Patient is a 59 year old female who is following up today for essential hypertension.  Patient is well managed on current medication lisinopril 20 mg tablet daily.  Patient is not reporting any signs and symptoms of hyper or hypotension.  Patient will continue to monitor blood pressure daily.  Take medication as prescribed.  Follow-up in 1 month.      Relevant Orders   CBC with Differential   Comprehensive metabolic panel   Lipid Panel      Follow-up: Return in about 4 weeks (around 01/24/2020).    Daryll Drown, NP

## 2019-12-27 NOTE — Patient Instructions (Addendum)
Check blood pressure daily, take medication as prescribed, follow up in 1 month.   Hypertension, Adult High blood pressure (hypertension) is when the force of blood pumping through the arteries is too strong. The arteries are the blood vessels that carry blood from the heart throughout the body. Hypertension forces the heart to work harder to pump blood and may cause arteries to become narrow or stiff. Untreated or uncontrolled hypertension can cause a heart attack, heart failure, a stroke, kidney disease, and other problems. A blood pressure reading consists of a higher number over a lower number. Ideally, your blood pressure should be below 120/80. The first ("top") number is called the systolic pressure. It is a measure of the pressure in your arteries as your heart beats. The second ("bottom") number is called the diastolic pressure. It is a measure of the pressure in your arteries as the heart relaxes. What are the causes? The exact cause of this condition is not known. There are some conditions that result in or are related to high blood pressure. What increases the risk? Some risk factors for high blood pressure are under your control. The following factors may make you more likely to develop this condition:  Smoking.  Having type 2 diabetes mellitus, high cholesterol, or both.  Not getting enough exercise or physical activity.  Being overweight.  Having too much fat, sugar, calories, or salt (sodium) in your diet.  Drinking too much alcohol. Some risk factors for high blood pressure may be difficult or impossible to change. Some of these factors include:  Having chronic kidney disease.  Having a family history of high blood pressure.  Age. Risk increases with age.  Race. You may be at higher risk if you are African American.  Gender. Men are at higher risk than women before age 27. After age 47, women are at higher risk than men.  Having obstructive sleep  apnea.  Stress. What are the signs or symptoms? High blood pressure may not cause symptoms. Very high blood pressure (hypertensive crisis) may cause:  Headache.  Anxiety.  Shortness of breath.  Nosebleed.  Nausea and vomiting.  Vision changes.  Severe chest pain.  Seizures. How is this diagnosed? This condition is diagnosed by measuring your blood pressure while you are seated, with your arm resting on a flat surface, your legs uncrossed, and your feet flat on the floor. The cuff of the blood pressure monitor will be placed directly against the skin of your upper arm at the level of your heart. It should be measured at least twice using the same arm. Certain conditions can cause a difference in blood pressure between your right and left arms. Certain factors can cause blood pressure readings to be lower or higher than normal for a short period of time:  When your blood pressure is higher when you are in a health care provider's office than when you are at home, this is called white coat hypertension. Most people with this condition do not need medicines.  When your blood pressure is higher at home than when you are in a health care provider's office, this is called masked hypertension. Most people with this condition may need medicines to control blood pressure. If you have a high blood pressure reading during one visit or you have normal blood pressure with other risk factors, you may be asked to:  Return on a different day to have your blood pressure checked again.  Monitor your blood pressure at home for 1  week or longer. If you are diagnosed with hypertension, you may have other blood or imaging tests to help your health care provider understand your overall risk for other conditions. How is this treated? This condition is treated by making healthy lifestyle changes, such as eating healthy foods, exercising more, and reducing your alcohol intake. Your health care provider may  prescribe medicine if lifestyle changes are not enough to get your blood pressure under control, and if:  Your systolic blood pressure is above 130.  Your diastolic blood pressure is above 80. Your personal target blood pressure may vary depending on your medical conditions, your age, and other factors. Follow these instructions at home: Eating and drinking   Eat a diet that is high in fiber and potassium, and low in sodium, added sugar, and fat. An example eating plan is called the DASH (Dietary Approaches to Stop Hypertension) diet. To eat this way: ? Eat plenty of fresh fruits and vegetables. Try to fill one half of your plate at each meal with fruits and vegetables. ? Eat whole grains, such as whole-wheat pasta, brown rice, or whole-grain bread. Fill about one fourth of your plate with whole grains. ? Eat or drink low-fat dairy products, such as skim milk or low-fat yogurt. ? Avoid fatty cuts of meat, processed or cured meats, and poultry with skin. Fill about one fourth of your plate with lean proteins, such as fish, chicken without skin, beans, eggs, or tofu. ? Avoid pre-made and processed foods. These tend to be higher in sodium, added sugar, and fat.  Reduce your daily sodium intake. Most people with hypertension should eat less than 1,500 mg of sodium a day.  Do not drink alcohol if: ? Your health care provider tells you not to drink. ? You are pregnant, may be pregnant, or are planning to become pregnant.  If you drink alcohol: ? Limit how much you use to:  0-1 drink a day for women.  0-2 drinks a day for men. ? Be aware of how much alcohol is in your drink. In the [place] one drink equals one 12 oz bottle of beer (355 mL), one 5 oz glass of wine (148 mL), or one 1 oz glass of hard liquor (44 mL). Lifestyle   Work with your health care provider to maintain a healthy body weight or to lose weight. Ask what an ideal weight is for you.  Get at least 30 minutes of exercise  most days of the week. Activities may include walking, swimming, or biking.  Include exercise to strengthen your muscles (resistance exercise), such as Pilates or lifting weights, as part of your weekly exercise routine. Try to do these types of exercises for 30 minutes at least 3 days a week.  Do not use any products that contain nicotine or tobacco, such as cigarettes, e-cigarettes, and chewing tobacco. If you need help quitting, ask your health care provider.  Monitor your blood pressure at home as told by your health care provider.  Keep all follow-up visits as told by your health care provider. This is important. Medicines  Take over-the-counter and prescription medicines only as told by your health care provider. Follow directions carefully. Blood pressure medicines must be taken as prescribed.  Do not skip doses of blood pressure medicine. Doing this puts you at risk for problems and can make the medicine less effective.  Ask your health care provider about side effects or reactions to medicines that you should watch for. Contact a health  care provider if you:  Think you are having a reaction to a medicine you are taking.  Have headaches that keep coming back (recurring).  Feel dizzy.  Have swelling in your ankles.  Have trouble with your vision. Get help right away if you:  Develop a severe headache or confusion.  Have unusual weakness or numbness.  Feel faint.  Have severe pain in your chest or abdomen.  Vomit repeatedly.  Have trouble breathing. Summary  Hypertension is when the force of blood pumping through your arteries is too strong. If this condition is not controlled, it may put you at risk for serious complications.  Your personal target blood pressure may vary depending on your medical conditions, your age, and other factors. For most people, a normal blood pressure is less than 120/80.  Hypertension is treated with lifestyle changes, medicines, or a  combination of both. Lifestyle changes include losing weight, eating a healthy, low-sodium diet, exercising more, and limiting alcohol. This information is not intended to replace advice given to you by your health care provider. Make sure you discuss any questions you have with your health care provider. Document Revised: 11/09/2017 Document Reviewed: 11/09/2017 Elsevier Patient Education  2020 [employer].

## 2019-12-27 NOTE — Assessment & Plan Note (Signed)
Patient is a 59 year old female who is following up today for essential hypertension.  Patient is well managed on current medication lisinopril 20 mg tablet daily.  Patient is not reporting any signs and symptoms of hyper or hypotension.  Patient will continue to monitor blood pressure daily.  Take medication as prescribed.  Follow-up in 1 month.

## 2019-12-28 LAB — CBC WITH DIFFERENTIAL/PLATELET
Basophils Absolute: 0.1 10*3/uL (ref 0.0–0.2)
Basos: 1 %
EOS (ABSOLUTE): 0.3 10*3/uL (ref 0.0–0.4)
Eos: 3 %
Hematocrit: 40.6 % (ref 34.0–46.6)
Hemoglobin: 13 g/dL (ref 11.1–15.9)
Immature Grans (Abs): 0 10*3/uL (ref 0.0–0.1)
Immature Granulocytes: 0 %
Lymphocytes Absolute: 2.5 10*3/uL (ref 0.7–3.1)
Lymphs: 33 %
MCH: 27.5 pg (ref 26.6–33.0)
MCHC: 32 g/dL (ref 31.5–35.7)
MCV: 86 fL (ref 79–97)
Monocytes Absolute: 0.8 10*3/uL (ref 0.1–0.9)
Monocytes: 10 %
Neutrophils Absolute: 4 10*3/uL (ref 1.4–7.0)
Neutrophils: 53 %
Platelets: 236 10*3/uL (ref 150–450)
RBC: 4.73 x10E6/uL (ref 3.77–5.28)
RDW: 13.1 % (ref 11.7–15.4)
WBC: 7.6 10*3/uL (ref 3.4–10.8)

## 2019-12-28 LAB — COMPREHENSIVE METABOLIC PANEL
ALT: 18 IU/L (ref 0–32)
AST: 20 IU/L (ref 0–40)
Albumin/Globulin Ratio: 2 (ref 1.2–2.2)
Albumin: 4.7 g/dL (ref 3.8–4.9)
Alkaline Phosphatase: 71 IU/L (ref 44–121)
BUN/Creatinine Ratio: 14 (ref 9–23)
BUN: 12 mg/dL (ref 6–24)
Bilirubin Total: 0.3 mg/dL (ref 0.0–1.2)
CO2: 25 mmol/L (ref 20–29)
Calcium: 9.4 mg/dL (ref 8.7–10.2)
Chloride: 104 mmol/L (ref 96–106)
Creatinine, Ser: 0.83 mg/dL (ref 0.57–1.00)
GFR calc Af Amer: 91 mL/min/{1.73_m2} (ref >59)
GFR calc non Af Amer: 79 mL/min/{1.73_m2} (ref >59)
Globulin, Total: 2.3 g/dL (ref 1.5–4.5)
Glucose: 94 mg/dL (ref 65–99)
Potassium: 4.2 mmol/L (ref 3.5–5.2)
Sodium: 143 mmol/L (ref 134–144)
Total Protein: 7 g/dL (ref 6.0–8.5)

## 2019-12-28 LAB — LIPID PANEL
Chol/HDL Ratio: 3.8 ratio (ref 0.0–4.4)
Cholesterol, Total: 195 mg/dL (ref 100–199)
HDL: 51 mg/dL (ref >39)
LDL Chol Calc (NIH): 128 mg/dL — ABNORMAL HIGH (ref 0–99)
Triglycerides: 89 mg/dL (ref 0–149)
VLDL Cholesterol Cal: 16 mg/dL (ref 5–40)

## 2020-01-02 ENCOUNTER — Telehealth: Payer: Self-pay

## 2020-01-02 NOTE — Telephone Encounter (Signed)
[email or web address]

## 2020-02-05 ENCOUNTER — Other Ambulatory Visit: Payer: Self-pay

## 2020-02-05 ENCOUNTER — Ambulatory Visit: Payer: 59 | Admitting: Family Medicine

## 2020-02-05 ENCOUNTER — Encounter: Payer: Self-pay | Admitting: Family Medicine

## 2020-02-05 VITALS — BP 131/72 | HR 74 | Temp 97.3°F | Ht 63.0 in | Wt 239.0 lb

## 2020-02-05 DIAGNOSIS — I10 Essential (primary) hypertension: Secondary | ICD-10-CM

## 2020-02-05 DIAGNOSIS — E7800 Pure hypercholesterolemia, unspecified: Secondary | ICD-10-CM | POA: Diagnosis not present

## 2020-02-05 MED ORDER — LISINOPRIL 20 MG PO TABS: 20.0000 mg | ORAL_TABLET | Freq: Every day | ORAL | 3 refills | Status: DC

## 2020-02-05 NOTE — Progress Notes (Signed)
Subjective: CC: Follow-up hypertension PCP: Kendra Ip, Kendra Benitez Kendra Benitez is a 59 y.o. female presenting to clinic today for:  1.  Hypertension Patient newly started on lisinopril 20 mg in October.  Repeat renal function panel was normal.  Her blood pressure however, had not come back to normal range.  She is here for 1 month follow-up.  She reports compliance with lisinopril 20 mg daily.  She is recently switched from nighttime dosing to a.m. dosing and this seems to be working out well for her.  No chest pain, shortness of breath, headache or edema.  Has had no issues with cough or swelling.  She is been monitoring her blood pressures and they have been ranging typically in the 120s to 130s with occasional 160s.  She was not aware that she should rest prior to blood pressure checks.   ROS: Per HPI  No Known Allergies Past Medical History:  Diagnosis Date  . Allergy   . GERD (gastroesophageal reflux disease)    history of   . Hyperlipidemia     Current Outpatient Medications:  .  lisinopril (ZESTRIL) 20 MG tablet, Take 1 tablet (20 mg total) by mouth daily., Disp: 90 tablet, Rfl: 1 Social History   Socioeconomic History  . Marital status: Married    Spouse name: Not on file  . Number of children: Not on file  . Years of education: Not on file  . Highest education level: Not on file  Occupational History  . Not on file  Tobacco Use  . Smoking status: Never Smoker  . Smokeless tobacco: Never Used  Vaping Use  . Vaping Use: Never used  Substance and Sexual Activity  . Alcohol use: No  . Drug use: No  . Sexual activity: Not on file  Other Topics Concern  . Not on file  Social History Narrative  . Not on file   Social Determinants of Health   Financial Resource Strain:   . Difficulty of Paying Living Expenses: Not on file  Food Insecurity:   . Worried About Programme researcher, broadcasting/film/video in the Last Year: Not on file  . Ran Out of Food in the Last Year: Not on  file  Transportation Needs:   . Lack of Transportation (Medical): Not on file  . Lack of Transportation (Non-Medical): Not on file  Physical Activity:   . Days of Exercise per Week: Not on file  . Minutes of Exercise per Session: Not on file  Stress:   . Feeling of Stress : Not on file  Social Connections:   . Frequency of Communication with Friends and Family: Not on file  . Frequency of Social Gatherings with Friends and Family: Not on file  . Attends Religious Services: Not on file  . Active Member of Clubs or Organizations: Not on file  . Attends Banker Meetings: Not on file  . Marital Status: Not on file  Intimate Partner Violence:   . Fear of Current or Ex-Partner: Not on file  . Emotionally Abused: Not on file  . Physically Abused: Not on file  . Sexually Abused: Not on file   Family History  Problem Relation Age of Onset  . Hypertension Mother   . Hypertension Father   . Diabetes Father   . Colon cancer Neg Hx   . Colon polyps Neg Hx   . Rectal cancer Neg Hx   . Stomach cancer Neg Hx     Objective: Office vital signs  reviewed. BP 131/72   Pulse 74   Temp (!) 97.3 F (36.3 C)   Ht 5\' 3"  (1.6 m)   Wt 239 lb (108.4 kg)   LMP 05/13/2013   SpO2 100%   BMI 42.34 kg/m   Physical Examination:  General: Awake, alert, well nourished, No acute distress HEENT: Normal, sclera white, MMM. No angioedema Cardio: regular rate and rhythm, S1S2 heard, no murmurs appreciated Pulm: clear to auscultation bilaterally, no wheezes, rhonchi or rales; normal work of breathing on room air Extremities: warm, well perfused, No edema, cyanosis or clubbing; +2 pulses bilaterally MSK: normal gait and station  Assessment/ Plan: 59 y.o. female   Essential hypertension  Pure hypercholesterolemia  Under good control now with lisinopril 20 mg daily.  Continue current regimen.  I reviewed her renal function test from October and this showed normal renal function.   Cholesterol was slightly elevated but patient will not take cholesterol medicine.  She is had previous intolerance  No orders of the defined types were placed in this encounter.  No orders of the defined types were placed in this encounter.    November, Kendra Benitez [facility] [phone]

## 2020-02-05 NOTE — Patient Instructions (Signed)
How to Take Your Blood Pressure You can take your blood pressure at home with a machine. You may need to check your blood pressure at home:  To check if you have high blood pressure (hypertension).  To check your blood pressure over time.  To make sure your blood pressure medicine is working. Supplies needed: You will need a blood pressure machine, or monitor. You can buy one at a drugstore or online. When choosing one:  Choose one with an arm cuff.  Choose one that wraps around your upper arm. Only one finger should fit between your arm and the cuff.  Do not choose one that measures your blood pressure from your wrist or finger. Your doctor can suggest a monitor. How to prepare Avoid these things for 30 minutes before checking your blood pressure:  Drinking caffeine.  Drinking alcohol.  Eating.  Smoking.  Exercising. Five minutes before checking your blood pressure:  Pee.  Sit in a dining chair. Avoid sitting in a soft couch or armchair.  Be quiet. Do not talk. How to take your blood pressure Follow the instructions that came with your machine. If you have a digital blood pressure monitor, these may be the instructions: 1. Sit up straight. 2. Place your feet on the floor. Do not cross your ankles or legs. 3. Rest your left arm at the level of your heart. You may rest it on a table, desk, or chair. 4. Pull up your shirt sleeve. 5. Wrap the blood pressure cuff around the upper part of your left arm. The cuff should be 1 inch (2.5 cm) above your elbow. It is best to wrap the cuff around bare skin. 6. Fit the cuff snugly around your arm. You should be able to place only one finger between the cuff and your arm. 7. Put the cord inside the groove of your elbow. 8. Press the power button. 9. Sit quietly while the cuff fills with air and loses air. 10. Write down the numbers on the screen. 11. Wait 2-3 minutes and then repeat steps 1-10. What do the numbers mean? Two  numbers make up your blood pressure. The first number is called systolic pressure. The second is called diastolic pressure. An example of a blood pressure reading is "120 over 80" (or 120/80). If you are an adult and do not have a medical condition, use this guide to find out if your blood pressure is normal: Normal  First number: below 120.  Second number: below 80. Elevated  First number: 120-129.  Second number: below 80. Hypertension stage 1  First number: 130-139.  Second number: 80-89. Hypertension stage 2  First number: 140 or above.  Second number: 90 or above. Your blood pressure is above normal even if only the top or bottom number is above normal. Follow these instructions at home:  Check your blood pressure as often as your doctor tells you to.  Take your monitor to your next doctor's appointment. Your doctor will: ? Make sure you are using it correctly. ? Make sure it is working right.  Make sure you understand what your blood pressure numbers should be.  Tell your doctor if your medicines are causing side effects. Contact a doctor if:  Your blood pressure keeps being high. Get help right away if:  Your first blood pressure number is higher than 180.  Your second blood pressure number is higher than 120. This information is not intended to replace advice given to you by your health   care provider. Make sure you discuss any questions you have with your health care provider. Document Revised: 02/11/2017 Document Reviewed: 08/08/2015 Elsevier Patient Education  2020 Elsevier Inc.  

## 2020-03-24 ENCOUNTER — Encounter: Payer: Self-pay | Admitting: Nurse Practitioner

## 2020-03-24 ENCOUNTER — Ambulatory Visit (INDEPENDENT_AMBULATORY_CARE_PROVIDER_SITE_OTHER): Payer: 59 | Admitting: Nurse Practitioner

## 2020-03-24 DIAGNOSIS — U071 COVID-19: Secondary | ICD-10-CM | POA: Diagnosis not present

## 2020-03-24 MED ORDER — PREDNISONE 20 MG PO TABS: ORAL_TABLET | ORAL | 0 refills | Status: DC

## 2020-03-24 MED ORDER — PROMETHAZINE-DM 6.25-15 MG/5ML PO SYRP: 5.0000 mL | ORAL_SOLUTION | Freq: Four times a day (QID) | ORAL | 0 refills | Status: DC | PRN

## 2020-03-24 NOTE — Progress Notes (Signed)
   Virtual Visit via telephone Note Due to COVID-19 pandemic this visit was conducted virtually. This visit type was conducted due to national recommendations for restrictions regarding the COVID-19 Pandemic (e.g. social distancing, sheltering in place) in an effort to limit this patient's exposure and mitigate transmission in our community. All issues noted in this document were discussed and addressed.  A physical exam was not performed with this format.  I connected with Kendra Benitez on 03/24/20 at 3:35 by telephone and verified that I am speaking with the correct person using two identifiers. Kendra Benitez is currently located at home and o one is currently with her during visit. The provider, Mary-Margaret Daphine Deutscher, FNP is located in their office at time of visit.  I discussed the limitations, risks, security and privacy concerns of performing an evaluation and management service by telephone and the availability of in person appointments. I also discussed with the patient that there may be a patient responsible charge related to this service. The patient expressed understanding and agreed to proceed.   History and Present Illness:   Chief Complaint: covid  Patient went to [facility] and had covid test done and it was positive. They told her her cough was really bad and that she needed to speak to her PCP.    Review of Systems  Constitutional: Positive for chills and malaise/fatigue. Negative for fever.  HENT: Positive for congestion. Negative for ear pain, sinus pain and sore throat.   Respiratory: Positive for cough and sputum production.   Musculoskeletal: Positive for myalgias.  Neurological: Positive for headaches. Focal weakness: fier.     Observations/Objective: Alert and oriented- answers all questions appropriately No distress Voice hoarse Dry cough noted   Assessment and Plan: Kendra Benitez in today with chief complaint of No chief complaint on file.   1. Lab  test positive for detection of COVID-19 virus Fore fluids Rest Run himidifier Quarantine until no longer symptomatic - promethazine-dextromethorphan (PROMETHAZINE-DM) 6.25-15 MG/5ML syrup; Take 5 mLs by mouth 4 (four) times daily as needed for cough.  Dispense: 118 mL; Refill: 0 - predniSONE (DELTASONE) 20 MG tablet; 2 po at sametime daily for 5 days  Dispense: 10 tablet; Refill: 0    Follow Up Instructions: prn    I discussed the assessment and treatment plan with the patient. The patient was provided an opportunity to ask questions and all were answered. The patient agreed with the plan and demonstrated an understanding of the instructions.   The patient was advised to call back or seek an in-person evaluation if the symptoms worsen or if the condition fails to improve as anticipated.  The above assessment and management plan was discussed with the patient. The patient verbalized understanding of and has agreed to the management plan. Patient is aware to call the clinic if symptoms persist or worsen. Patient is aware when to return to the clinic for a follow-up visit. Patient educated on when it is appropriate to go to the emergency department.   Time call ended:  3:48  I provided 13 minutes of non-face-to-face time during this encounter.    Mary-Margaret Daphine Deutscher, FNP

## 2020-04-08 ENCOUNTER — Other Ambulatory Visit: Payer: Self-pay

## 2020-04-08 ENCOUNTER — Encounter: Payer: Self-pay | Admitting: Family

## 2020-04-08 ENCOUNTER — Ambulatory Visit (INDEPENDENT_AMBULATORY_CARE_PROVIDER_SITE_OTHER): Payer: 59

## 2020-04-08 ENCOUNTER — Ambulatory Visit: Payer: 59 | Admitting: Family

## 2020-04-08 VITALS — BP 136/76 | HR 93 | Temp 97.7°F | Ht 63.0 in | Wt 232.4 lb

## 2020-04-08 DIAGNOSIS — R5383 Other fatigue: Secondary | ICD-10-CM

## 2020-04-08 DIAGNOSIS — I10 Essential (primary) hypertension: Secondary | ICD-10-CM

## 2020-04-08 DIAGNOSIS — R531 Weakness: Secondary | ICD-10-CM

## 2020-04-08 DIAGNOSIS — U099 Post covid-19 condition, unspecified: Secondary | ICD-10-CM

## 2020-04-08 NOTE — Patient Instructions (Signed)

## 2020-04-08 NOTE — Progress Notes (Signed)
Subjective:    Patient ID: Kendra Benitez, female    DOB: February 09, 1961, 60 y.o.   MRN: [id number]  Chief Complaint  Patient presents with  . Hypertension    Denies HA, Vision changes or dizziness.Patient had COVID 2-3 weeks ago.   Pt presents to the office today with complaints of HTN. She reports she has been taking lisinopril 20 mg daily and was doing well. However, she was diagnosed with COVID about 3 weeks ago and since she has had lows and highs on her BP.   She reports the lowest BP she has was 89/54 and highest was 179/66. She states she has not been taking her lisinopril regularly when she had COVID, because she was nervous about it dropping.   Her BP is at goal today and has taken her medication today.   She reports she continues to have fatigue and weakness. She went back to work last week. States she has mild SOB with exertion. Denies any fever.  Hypertension This is a chronic problem. The current episode started more than 1 year ago. The problem has been rapidly improving since onset. Associated symptoms include malaise/fatigue. Pertinent negatives include no peripheral edema or shortness of breath. Past treatments include ACE inhibitors. The current treatment provides moderate improvement.      Review of Systems  Constitutional: Positive for malaise/fatigue.  Respiratory: Negative for shortness of breath.   All other systems reviewed and are negative.      Objective:   Physical Exam Vitals reviewed.  Constitutional:      General: She is not in acute distress.    Appearance: She is well-developed and well-nourished.  HENT:     Head: Normocephalic and atraumatic.     Mouth/Throat:     Mouth: Oropharynx is clear and moist.  Eyes:     Pupils: Pupils are equal, round, and reactive to light.  Neck:     Thyroid: No thyromegaly.  Cardiovascular:     Rate and Rhythm: Normal rate and regular rhythm.     Pulses: Intact distal pulses.     Heart sounds: Normal heart  sounds. No murmur heard.   Pulmonary:     Effort: Pulmonary effort is normal. No respiratory distress.     Breath sounds: Normal breath sounds. No wheezing.  Abdominal:     General: Bowel sounds are normal. There is no distension.     Palpations: Abdomen is soft.     Tenderness: There is no abdominal tenderness.  Musculoskeletal:        General: No tenderness or edema. Normal range of motion.     Cervical back: Normal range of motion and neck supple.  Skin:    General: Skin is warm and dry.  Neurological:     Mental Status: She is alert and oriented to person, place, and time.     Cranial Nerves: No cranial nerve deficit.     Deep Tendon Reflexes: Reflexes are normal and symmetric.  Psychiatric:        Mood and Affect: Mood and affect normal.        Behavior: Behavior normal.        Thought Content: Thought content normal.        Judgment: Judgment normal.       BP 136/76   Pulse 93   Temp 97.7 F (36.5 C) (Temporal)   Ht $R'5\' 3"'SW$  (1.6 m)   Wt 232 lb 6.4 oz (105.4 kg)   LMP 05/13/2013   BMI  41.17 kg/m      Assessment & Plan:  Kendra Benitez comes in today with chief complaint of Hypertension (Denies HA, Vision changes or dizziness.Patient had COVID 2-3 weeks ago.)   Diagnosis and orders addressed:  1. 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 Weakness - DG Chest 2 View; Future - CMP14+EGFR - CBC with Differential/Platelet  4. Fatigue, unspecified type - DG Chest 2 View; Future - CMP14+EGFR - CBC with Differential/Platelet   Labs pending Encouraged exercise Keep follow up with PCP     Evelina Dun, FNP

## 2020-04-09 ENCOUNTER — Telehealth: Payer: Self-pay

## 2020-04-09 DIAGNOSIS — R7989 Other specified abnormal findings of blood chemistry: Secondary | ICD-10-CM

## 2020-04-09 LAB — CBC WITH DIFFERENTIAL/PLATELET
Basophils Absolute: 0.1 10*3/uL (ref 0.0–0.2)
Basos: 1 %
EOS (ABSOLUTE): 0.2 10*3/uL (ref 0.0–0.4)
Eos: 2 %
Hematocrit: 42.1 % (ref 34.0–46.6)
Hemoglobin: 14 g/dL (ref 11.1–15.9)
Immature Grans (Abs): 0 10*3/uL (ref 0.0–0.1)
Immature Granulocytes: 0 %
Lymphocytes Absolute: 2.4 10*3/uL (ref 0.7–3.1)
Lymphs: 26 %
MCH: 28.5 pg (ref 26.6–33.0)
MCHC: 33.3 g/dL (ref 31.5–35.7)
MCV: 86 fL (ref 79–97)
Monocytes Absolute: 0.9 10*3/uL (ref 0.1–0.9)
Monocytes: 10 %
Neutrophils Absolute: 5.6 10*3/uL (ref 1.4–7.0)
Neutrophils: 61 %
Platelets: 246 10*3/uL (ref 150–450)
RBC: 4.92 x10E6/uL (ref 3.77–5.28)
RDW: 13.5 % (ref 11.7–15.4)
WBC: 9.2 10*3/uL (ref 3.4–10.8)

## 2020-04-09 LAB — CMP14+EGFR
ALT: 15 IU/L (ref 0–32)
AST: 19 IU/L (ref 0–40)
Albumin/Globulin Ratio: 1.8 (ref 1.2–2.2)
Albumin: 4.6 g/dL (ref 3.8–4.9)
Alkaline Phosphatase: 83 IU/L (ref 44–121)
BUN/Creatinine Ratio: 15 (ref 9–23)
BUN: 16 mg/dL (ref 6–24)
Bilirubin Total: 0.3 mg/dL (ref 0.0–1.2)
CO2: 26 mmol/L (ref 20–29)
Calcium: 9.9 mg/dL (ref 8.7–10.2)
Chloride: 101 mmol/L (ref 96–106)
Creatinine, Ser: 1.1 mg/dL — ABNORMAL HIGH (ref 0.57–1.00)
GFR calc Af Amer: 64 mL/min/{1.73_m2} (ref >59)
GFR calc non Af Amer: 56 mL/min/{1.73_m2} — ABNORMAL LOW (ref >59)
Globulin, Total: 2.5 g/dL (ref 1.5–4.5)
Glucose: 109 mg/dL — ABNORMAL HIGH (ref 65–99)
Potassium: 4.6 mmol/L (ref 3.5–5.2)
Sodium: 142 mmol/L (ref 134–144)
Total Protein: 7.1 g/dL (ref 6.0–8.5)

## 2020-04-09 NOTE — Telephone Encounter (Signed)
Please review and advise.  Patient had appointment & chest xray with Christy yesterday.

## 2020-04-09 NOTE — Telephone Encounter (Signed)
Patient aware of results.  She will come in one week to have labs redrawn.

## 2020-04-09 NOTE — Telephone Encounter (Signed)
Chest x-ray was unremarkable Her CBC was normal She does have a small rise in creatinine, suggesting an acute kidney injury.  Please make sure she is hydrating with water, avoiding NSAID medications.  Recommend recheck in a week

## 2020-09-19 ENCOUNTER — Other Ambulatory Visit: Payer: Self-pay

## 2020-09-19 ENCOUNTER — Ambulatory Visit: Payer: 59 | Admitting: Family

## 2020-09-19 VITALS — BP 128/72 | HR 69 | Temp 96.6°F | Ht 63.0 in | Wt 236.4 lb

## 2020-09-19 DIAGNOSIS — F320 Major depressive disorder, single episode, mild: Secondary | ICD-10-CM

## 2020-09-19 DIAGNOSIS — I10 Essential (primary) hypertension: Secondary | ICD-10-CM

## 2020-09-19 DIAGNOSIS — E7800 Pure hypercholesterolemia, unspecified: Secondary | ICD-10-CM

## 2020-09-19 DIAGNOSIS — E559 Vitamin D deficiency, unspecified: Secondary | ICD-10-CM

## 2020-09-19 DIAGNOSIS — F419 Anxiety disorder, unspecified: Secondary | ICD-10-CM | POA: Diagnosis not present

## 2020-09-19 DIAGNOSIS — Z114 Encounter for screening for human immunodeficiency virus [HIV]: Secondary | ICD-10-CM

## 2020-09-19 DIAGNOSIS — Z1159 Encounter for screening for other viral diseases: Secondary | ICD-10-CM

## 2020-09-19 MED ORDER — LISINOPRIL 20 MG PO TABS: 20.0000 mg | ORAL_TABLET | Freq: Every day | ORAL | 3 refills | Status: DC

## 2020-09-19 NOTE — Patient Instructions (Signed)

## 2020-09-19 NOTE — Progress Notes (Signed)
Subjective:    Patient ID: Kendra Benitez, female    DOB: 27-Jun-1960, 60 y.o.   MRN: [id number]  Chief Complaint  Patient presents with   Hypertension   Medical Management of Chronic Issues   Pt presents to the office today for chronic follow up.  Hypertension This is a chronic problem. The current episode started more than 1 year ago. The problem has been waxing and waning since onset. The problem is uncontrolled. Associated symptoms include anxiety and malaise/fatigue. Pertinent negatives include no peripheral edema. Risk factors for coronary artery disease include dyslipidemia, obesity and sedentary lifestyle. Past treatments include ACE inhibitors. The current treatment provides mild improvement.  Hyperlipidemia This is a chronic problem. The current episode started more than 1 year ago. Exacerbating diseases include obesity. Current antihyperlipidemic treatment includes statins. The current treatment provides moderate improvement of lipids. Risk factors for coronary artery disease include dyslipidemia, hypertension, a sedentary lifestyle and post-menopausal.  Depression        This is a chronic problem.  The current episode started more than 1 year ago.   The onset quality is gradual.   The problem occurs intermittently.  Associated symptoms include restlessness and sad.  Associated symptoms include no helplessness and no hopelessness.  Past medical history includes anxiety.   Anxiety Presents for follow-up visit. Symptoms include depressed mood, excessive worry, nervous/anxious behavior and restlessness. Patient reports no irritability. Symptoms occur occasionally.       Review of Systems  Constitutional:  Positive for malaise/fatigue. Negative for irritability.  Psychiatric/Behavioral:  Positive for depression. The patient is nervous/anxious.   All other systems reviewed and are negative.     Objective:   Physical Exam Vitals reviewed.  Constitutional:      General: She is  not in acute distress.    Appearance: She is well-developed. She is obese.  HENT:     Head: Normocephalic and atraumatic.     Right Ear: Tympanic membrane normal.     Left Ear: Tympanic membrane normal.  Eyes:     Pupils: Pupils are equal, round, and reactive to light.  Neck:     Thyroid: No thyromegaly.  Cardiovascular:     Rate and Rhythm: Normal rate and regular rhythm.     Heart sounds: Normal heart sounds. No murmur heard. Pulmonary:     Effort: Pulmonary effort is normal. No respiratory distress.     Breath sounds: Normal breath sounds. No wheezing.  Abdominal:     General: Bowel sounds are normal. There is no distension.     Palpations: Abdomen is soft.     Tenderness: There is no abdominal tenderness.  Musculoskeletal:        General: No tenderness. Normal range of motion.     Cervical back: Normal range of motion and neck supple.  Skin:    General: Skin is warm and dry.  Neurological:     Mental Status: She is alert and oriented to person, place, and time.     Cranial Nerves: No cranial nerve deficit.     Deep Tendon Reflexes: Reflexes are normal and symmetric.  Psychiatric:        Behavior: Behavior normal.        Thought Content: Thought content normal.        Judgment: Judgment normal.      BP (!) 144/74   Pulse 69   Temp (!) 96.6 F (35.9 C) (Temporal)   Ht 5\' 3"  (1.6 m)   Wt 236 lb  6.4 oz (107.2 kg)   LMP 05/13/2013   SpO2 100%   BMI 41.88 kg/m      Assessment & Plan:  Kendra Benitez comes in today with chief complaint of Hypertension and Medical Management of Chronic Issues   Diagnosis and orders addressed:  1. Essential hypertension - lisinopril (ZESTRIL) 20 MG tablet; Take 1 tablet (20 mg total) by mouth daily.  Dispense: 90 tablet; Refill: 3 - CMP14+EGFR - CBC with Differential/Platelet - HIV Antibody (routine testing w rflx) - Hepatitis C antibody  2. Depression, major, single episode, mild (HCC) - CMP14+EGFR - CBC with  Differential/Platelet  3. Anxiety - CMP14+EGFR - CBC with Differential/Platelet  4. Pure hypercholesterolemia - CMP14+EGFR - CBC with Differential/Platelet  5. Vitamin D deficiency - CMP14+EGFR - CBC with Differential/Platelet  6. Morbid obesity ([place]) - CMP14+EGFR - CBC with Differential/Platelet  7. Encounter for screening for HIV  - CMP14+EGFR - CBC with Differential/Platelet - HIV Antibody (routine testing w rflx)  8. Need for hepatitis C screening test - CMP14+EGFR - CBC with Differential/Platelet - Hepatitis C antibody   Labs pending Health Maintenance reviewed Diet and exercise encouraged  Follow up plan: 6 months   Evelina Dun, FNP

## 2020-09-20 LAB — CMP14+EGFR
ALT: 17 IU/L (ref 0–32)
AST: 17 IU/L (ref 0–40)
Albumin/Globulin Ratio: 2.3 — ABNORMAL HIGH (ref 1.2–2.2)
Albumin: 5 g/dL — ABNORMAL HIGH (ref 3.8–4.9)
Alkaline Phosphatase: 77 IU/L (ref 44–121)
BUN/Creatinine Ratio: 11 (ref 9–23)
BUN: 9 mg/dL (ref 6–24)
Bilirubin Total: 0.3 mg/dL (ref 0.0–1.2)
CO2: 25 mmol/L (ref 20–29)
Calcium: 9.7 mg/dL (ref 8.7–10.2)
Chloride: 102 mmol/L (ref 96–106)
Creatinine, Ser: 0.83 mg/dL (ref 0.57–1.00)
Globulin, Total: 2.2 g/dL (ref 1.5–4.5)
Glucose: 82 mg/dL (ref 65–99)
Potassium: 4.4 mmol/L (ref 3.5–5.2)
Sodium: 143 mmol/L (ref 134–144)
Total Protein: 7.2 g/dL (ref 6.0–8.5)
eGFR: 82 mL/min/{1.73_m2} (ref >59)

## 2020-09-20 LAB — CBC WITH DIFFERENTIAL/PLATELET
Basophils Absolute: 0 10*3/uL (ref 0.0–0.2)
Basos: 1 %
EOS (ABSOLUTE): 0.2 10*3/uL (ref 0.0–0.4)
Eos: 3 %
Hematocrit: 39 % (ref 34.0–46.6)
Hemoglobin: 12.9 g/dL (ref 11.1–15.9)
Immature Grans (Abs): 0 10*3/uL (ref 0.0–0.1)
Immature Granulocytes: 0 %
Lymphocytes Absolute: 2.4 10*3/uL (ref 0.7–3.1)
Lymphs: 35 %
MCH: 27.8 pg (ref 26.6–33.0)
MCHC: 33.1 g/dL (ref 31.5–35.7)
MCV: 84 fL (ref 79–97)
Monocytes Absolute: 0.7 10*3/uL (ref 0.1–0.9)
Monocytes: 10 %
Neutrophils Absolute: 3.6 10*3/uL (ref 1.4–7.0)
Neutrophils: 51 %
Platelets: 240 10*3/uL (ref 150–450)
RBC: 4.64 x10E6/uL (ref 3.77–5.28)
RDW: 13.3 % (ref 11.7–15.4)
WBC: 7 10*3/uL (ref 3.4–10.8)

## 2020-09-20 LAB — HEPATITIS C ANTIBODY: Hep C Virus Ab: 0.1 s/co ratio (ref 0.0–0.9)

## 2020-09-20 LAB — HIV ANTIBODY (ROUTINE TESTING W REFLEX): HIV Screen 4th Generation wRfx: NONREACTIVE

## 2020-12-18 ENCOUNTER — Ambulatory Visit (INDEPENDENT_AMBULATORY_CARE_PROVIDER_SITE_OTHER): Payer: 59 | Admitting: Family

## 2020-12-18 ENCOUNTER — Encounter: Payer: Self-pay | Admitting: Family

## 2020-12-18 VITALS — BP 161/82 | HR 98 | Temp 97.5°F | Ht 63.0 in | Wt 232.0 lb

## 2020-12-18 DIAGNOSIS — J209 Acute bronchitis, unspecified: Secondary | ICD-10-CM | POA: Diagnosis not present

## 2020-12-18 MED ORDER — ALBUTEROL SULFATE HFA 108 (90 BASE) MCG/ACT IN AERS: 2.0000 | INHALATION_SPRAY | Freq: Four times a day (QID) | RESPIRATORY_TRACT | 0 refills | Status: DC | PRN

## 2020-12-18 MED ORDER — PREDNISONE 10 MG (21) PO TBPK
ORAL_TABLET | ORAL | 0 refills | Status: DC
Start: 2020-12-18 — End: 2022-01-21

## 2020-12-18 NOTE — Progress Notes (Signed)
Subjective:    Patient ID: Kendra Benitez, female    DOB: 03/22/1960, 60 y.o.   MRN: [id number]  Chief Complaint  Patient presents with   Cough   Nasal Congestion    Started last Friday    PT presents to the office today with cough and congestion that started last week.  Cough This is a new problem. The current episode started in the past 7 days. The problem has been waxing and waning. The problem occurs every few minutes. The cough is Non-productive. Associated symptoms include ear pain (left), nasal congestion and postnasal drip. Pertinent negatives include no ear congestion, fever, headaches, myalgias, sore throat, shortness of breath or wheezing. The symptoms are aggravated by lying down. She has tried rest and OTC cough suppressant for the symptoms. The treatment provided mild relief.     Review of Systems  Constitutional:  Negative for fever.  HENT:  Positive for ear pain (left) and postnasal drip. Negative for sore throat.   Respiratory:  Positive for cough. Negative for shortness of breath and wheezing.   Musculoskeletal:  Negative for myalgias.  Neurological:  Negative for headaches.  All other systems reviewed and are negative.     Objective:   Physical Exam Vitals reviewed.  Constitutional:      General: She is not in acute distress.    Appearance: She is well-developed. She is obese.  HENT:     Head: Normocephalic and atraumatic.     Right Ear: Tympanic membrane normal.     Left Ear: Tympanic membrane normal.  Eyes:     Pupils: Pupils are equal, round, and reactive to light.  Neck:     Thyroid: No thyromegaly.  Cardiovascular:     Rate and Rhythm: Normal rate and regular rhythm.     Heart sounds: Normal heart sounds. No murmur heard. Pulmonary:     Effort: Pulmonary effort is normal. No respiratory distress.     Breath sounds: Wheezing present.  Abdominal:     General: Bowel sounds are normal. There is no distension.     Palpations: Abdomen is soft.      Tenderness: There is no abdominal tenderness.  Musculoskeletal:        General: No tenderness. Normal range of motion.     Cervical back: Normal range of motion and neck supple.  Skin:    General: Skin is warm and dry.  Neurological:     Mental Status: She is alert and oriented to person, place, and time.     Cranial Nerves: No cranial nerve deficit.     Deep Tendon Reflexes: Reflexes are normal and symmetric.  Psychiatric:        Behavior: Behavior normal.        Thought Content: Thought content normal.        Judgment: Judgment normal.     BP (!) 161/82   Pulse 98   Temp (!) 97.5 F (36.4 C) (Temporal)   Ht 5\' 3"  (1.6 m)   Wt 232 lb (105.2 kg)   LMP 05/13/2013   BMI 41.10 kg/m       Kendra Benitez comes in today with chief complaint of Cough and Nasal Congestion (Started last Friday )   Diagnosis and orders addressed:  1. Acute bronchitis, unspecified organism - Take meds as prescribed - Use a cool mist humidifier  -Use saline nose sprays frequently -Force fluids -For any cough or congestion  Use plain Mucinex- regular strength or max strength is  fine -For fever or aces or pains- take tylenol or ibuprofen. -Throat lozenges if help -RTO if symptoms worsen or do not improve  - albuterol (VENTOLIN HFA) 108 (90 Base) MCG/ACT inhaler; Inhale 2 puffs into the lungs every 6 (six) hours as needed for wheezing or shortness of breath.  Dispense: 8 g; Refill: 0 - predniSONE (STERAPRED UNI-PAK 21 TAB) 10 MG (21) TBPK tablet; Use as directed  Dispense: 21 tablet; Refill: 0  Jannifer Rodney, FNP

## 2020-12-18 NOTE — Patient Instructions (Signed)
Acute Bronchitis, Adult  Acute bronchitis is sudden or acute swelling of the air tubes (bronchi) in the lungs. Acute bronchitis causes these tubes to fill with mucus, whichcan make it hard to breathe. It can also cause coughing or wheezing. In adults, acute bronchitis usually goes away within 2 weeks. A cough caused by bronchitis may last up to 3 weeks. Smoking, allergies, and asthma can make thecondition worse. What are the causes? This condition can be caused by germs and by substances that irritate the lungs, including: Cold and flu viruses. The most common cause of this condition is the virus that causes the common cold. Bacteria. Substances that irritate the lungs, including: Smoke from cigarettes and other forms of tobacco. Dust and pollen. Fumes from chemical products, gases, or burned fuel. Other materials that pollute indoor or outdoor air. Close contact with someone who has acute bronchitis. What increases the risk? The following factors may make you more likely to develop this condition: A weak body's defense system, also called the immune system. A condition that affects your lungs and breathing, such as asthma. What are the signs or symptoms? Common symptoms of this condition include: Lung and breathing problems, such as: Coughing. This may bring up clear, yellow, or green mucus from your lungs (sputum). Wheezing. Having too much mucus in your lungs (chest congestion). Having shortness of breath. A fever. Chills. Aches and pains, including: Tightness in your chest and other body aches. A sore throat. How is this diagnosed? This condition is usually diagnosed based on: Your symptoms and medical history. A physical exam. You may also have other tests, including tests to rule out other conditions, such as pneumonia. These tests include: A test of lung function. Test of a mucus sample to look for the presence of bacteria. Tests to check the oxygen level in your  blood. Blood tests. Chest X-ray. How is this treated? Most cases of acute bronchitis clear up over time without treatment. Your health care provider may recommend: Drinking more fluids. This can thin your mucus, which may improve your breathing. Using a device that gets medicine into your lungs (inhaler) to help improve breathing and control coughing. Using a vaporizer or a humidifier. These are machines that add water to the air to help you breathe better. Taking a medicine for a fever. Taking a medicine that thins mucus and clears congestion (expectorant). Taking a medicine that prevents or stops coughing (cough suppressant). Follow these instructions at home: Activity Get plenty of rest. Return to your normal activities as told by your health care provider. Ask your health care provider what activities are safe for you. Lifestyle  Drink enough fluid to keep your urine pale yellow. Do not drink alcohol. Do not use any products that contain nicotine or tobacco, such as cigarettes, e-cigarettes, and chewing tobacco. If you need help quitting, ask your health care provider. Be aware that: Your bronchitis will get worse if you smoke or breathe in other people's smoke (secondhand smoke). Your lungs will heal faster if you quit smoking.  General instructions Take over-the-counter and prescription medicines only as told by your health care provider. Use an inhaler, vaporizer, or humidifier as told by your health care provider. If you have a sore throat, gargle with a salt-water mixture 3-4 times a day or as needed. To make a salt-water mixture, completely dissolve -1 tsp (3-6 g) of salt in 1 cup (237 mL) of warm water. Take two teaspoons of honey at bedtime to lessen coughing at night.   Keep all follow-up visits as told by your health care provider. This is important. How is this prevented? To lower your risk of getting this condition again: Wash your hands often with soap and water. If  soap and water are not available, use hand sanitizer. Avoid contact with people who have cold symptoms. Try not to touch your mouth, nose, or eyes with your hands. Avoid places where there are fumes from chemicals. Breathing these fumes will make your condition worse. Get the flu shot every year. Contact a health care provider if: Your symptoms do not improve after 2 weeks of treatment. You vomit more than once or twice. You have symptoms of dehydration such as: Dark urine. Dry skin or eyes. Increased thirst. Headaches. Confusion. Muscle cramps. Get help right away if you: Cough up blood. Feel pain in your chest. Have severe shortness of breath. Faint or keep feeling like you are going to faint. Have a severe headache. Have fever or chills that get worse. These symptoms may represent a serious problem that is an emergency. Do not wait to see if the symptoms will go away. Get medical help right away. Call your local emergency services (911 in the [place] Do not drive yourself to the hospital. Summary Acute bronchitis is sudden (acute) inflammation of the air tubes (bronchi) between the windpipe and the lungs. In adults, acute bronchitis usually goes away within 2 weeks, although coughing may last 3 weeks or longer. Take over-the-counter and prescription medicines only as told by your health care provider. Drink enough fluid to keep your urine pale yellow. Contact a health care provider if your symptoms do not improve after 2 weeks of treatment. Get help right away if you cough up blood, faint, or have chest pain or shortness of breath. This information is not intended to replace advice given to you by your health care provider. Make sure you discuss any questions you have with your healthcare provider. Document Revised: 01/30/2020 Document Reviewed: 09/22/2018 Elsevier Patient Education  2022 Elsevier Inc.  

## 2022-01-11 ENCOUNTER — Other Ambulatory Visit: Payer: Self-pay | Admitting: Family

## 2022-01-11 DIAGNOSIS — I10 Essential (primary) hypertension: Secondary | ICD-10-CM

## 2022-01-14 ENCOUNTER — Telehealth: Payer: Self-pay | Admitting: Family

## 2022-01-14 DIAGNOSIS — I10 Essential (primary) hypertension: Secondary | ICD-10-CM

## 2022-01-14 MED ORDER — LISINOPRIL 20 MG PO TABS: 20.0000 mg | ORAL_TABLET | Freq: Every day | ORAL | 0 refills | Status: DC

## 2022-01-14 NOTE — Telephone Encounter (Signed)
  Prescription Request  01/14/2022  Is this a "Controlled Substance" medicine? no  Have you seen your PCP in the last 2 weeks? Appt made for 11/9  If YES, route message to pool  -  If NO, patient needs to be scheduled for appointment.  What is the name of the medication or equipment? lisinopril (ZESTRIL) 20 MG tablet   Have you contacted your pharmacy to request a refill? yes   Which pharmacy would you like this sent to? [facility] in [place]   Patient notified that their request is being sent to the clinical staff for review and that they should receive a response within 2 business days.

## 2022-01-14 NOTE — Telephone Encounter (Signed)
One month supply sent to [facility] in [place].

## 2022-01-21 ENCOUNTER — Ambulatory Visit: Payer: 59 | Admitting: Family

## 2022-01-21 ENCOUNTER — Encounter: Payer: Self-pay | Admitting: Family

## 2022-01-21 VITALS — BP 116/74 | HR 71 | Temp 97.5°F | Ht 63.0 in | Wt 231.2 lb

## 2022-01-21 DIAGNOSIS — Z0001 Encounter for general adult medical examination with abnormal findings: Secondary | ICD-10-CM

## 2022-01-21 DIAGNOSIS — E559 Vitamin D deficiency, unspecified: Secondary | ICD-10-CM

## 2022-01-21 DIAGNOSIS — I10 Essential (primary) hypertension: Secondary | ICD-10-CM | POA: Diagnosis not present

## 2022-01-21 DIAGNOSIS — E7800 Pure hypercholesterolemia, unspecified: Secondary | ICD-10-CM

## 2022-01-21 DIAGNOSIS — Z0000 Encounter for general adult medical examination without abnormal findings: Secondary | ICD-10-CM

## 2022-01-21 MED ORDER — LISINOPRIL 20 MG PO TABS: 20.0000 mg | ORAL_TABLET | Freq: Every day | ORAL | 3 refills | Status: DC

## 2022-01-21 NOTE — Progress Notes (Signed)
Subjective:    Patient ID: Kendra Benitez, female    DOB: 1960-10-05, 61 y.o.   MRN: [id number]  Chief Complaint  Patient presents with   Medical Management of Chronic Issues   Pt presents to the office today for CPE without pap. Hypertension This is a chronic problem. The current episode started more than 1 year ago. The problem has been waxing and waning since onset. The problem is uncontrolled. Associated symptoms include peripheral edema ("at the end of the day"). Pertinent negatives include no malaise/fatigue or shortness of breath. Risk factors for coronary artery disease include obesity. The current treatment provides moderate improvement.  Hyperlipidemia This is a chronic problem. The current episode started more than 1 year ago. The problem is uncontrolled. Exacerbating diseases include obesity. Pertinent negatives include no shortness of breath. Current antihyperlipidemic treatment includes statins and diet change. The current treatment provides no improvement of lipids. Risk factors for coronary artery disease include hypertension, a sedentary lifestyle, post-menopausal and dyslipidemia.      Review of Systems  Constitutional:  Negative for malaise/fatigue.  Respiratory:  Negative for shortness of breath.   All other systems reviewed and are negative.   Family History  Problem Relation Age of Onset   Hypertension Mother    Hypertension Father    Diabetes Father    Colon cancer Neg Hx    Colon polyps Neg Hx    Rectal cancer Neg Hx    Stomach cancer Neg Hx    Social History   Socioeconomic History   Marital status: Married    Spouse name: Not on file   Number of children: Not on file   Years of education: Not on file   Highest education level: Not on file  Occupational History   Not on file  Tobacco Use   Smoking status: Never   Smokeless tobacco: Never  Vaping Use   Vaping Use: Never used  Substance and Sexual Activity   Alcohol use: No   Drug use: No    Sexual activity: Not on file  Other Topics Concern   Not on file  Social History Narrative   Not on file   Social Determinants of Health   Financial Resource Strain: Not on file  Food Insecurity: Not on file  Transportation Needs: Not on file  Physical Activity: Not on file  Stress: Not on file  Social Connections: Not on file       Objective:   Physical Exam Vitals reviewed.  Constitutional:      General: She is not in acute distress.    Appearance: She is well-developed. She is obese.  HENT:     Head: Normocephalic and atraumatic.     Right Ear: Tympanic membrane normal.     Left Ear: Tympanic membrane normal.  Eyes:     Pupils: Pupils are equal, round, and reactive to light.  Neck:     Thyroid: No thyromegaly.  Cardiovascular:     Rate and Rhythm: Normal rate and regular rhythm.     Heart sounds: Normal heart sounds. No murmur heard. Pulmonary:     Effort: Pulmonary effort is normal. No respiratory distress.     Breath sounds: Normal breath sounds. No wheezing.  Abdominal:     General: Bowel sounds are normal. There is no distension.     Palpations: Abdomen is soft.     Tenderness: There is no abdominal tenderness.  Musculoskeletal:        General: No tenderness. Normal range  of motion.     Cervical back: Normal range of motion and neck supple.  Skin:    General: Skin is warm and dry.  Neurological:     Mental Status: She is alert and oriented to person, place, and time.     Cranial Nerves: No cranial nerve deficit.     Deep Tendon Reflexes: Reflexes are normal and symmetric.  Psychiatric:        Behavior: Behavior normal.        Thought Content: Thought content normal.        Judgment: Judgment normal.       BP 116/74   Pulse 71   Temp (!) 97.5 F (36.4 C) (Temporal)   Ht _0  (1.6 m)   Wt 231 lb 3.2 oz (104.9 kg)   LMP 05/13/2013   BMI 40.96 kg/m      Assessment & Plan:   MARCIANNA DAILY comes in today with chief complaint of Medical  Management of Chronic Issues   Diagnosis and orders addressed:  1. Essential hypertension - lisinopril (ZESTRIL) 20 MG tablet; Take 1 tablet (20 mg total) by mouth daily.  Dispense: 90 tablet; Refill: 3 - CMP14+EGFR - CBC with Differential/Platelet  2. Annual physical exam - CMP14+EGFR - CBC with Differential/Platelet - Lipid panel - TSH - VITAMIN D 25 Hydroxy (Vit-D Deficiency, Fractures)  3. Vitamin D deficiency - CMP14+EGFR - CBC with Differential/Platelet - VITAMIN D 25 Hydroxy (Vit-D Deficiency, Fractures)  4. Morbid obesity ([place]) - CMP14+EGFR - CBC with Differential/Platelet  5. Pure hypercholesterolemia - CMP14+EGFR - CBC with Differential/Platelet - Lipid panel   Labs pending Health Maintenance reviewed Diet and exercise encouraged  Follow up plan: 1 year    Evelina Dun, FNP

## 2022-01-21 NOTE — Patient Instructions (Signed)

## 2022-01-22 ENCOUNTER — Other Ambulatory Visit: Payer: Self-pay | Admitting: Family

## 2022-01-22 LAB — LIPID PANEL
Chol/HDL Ratio: 4 ratio (ref 0.0–4.4)
Cholesterol, Total: 223 mg/dL — ABNORMAL HIGH (ref 100–199)
HDL: 56 mg/dL (ref >39)
LDL Chol Calc (NIH): 155 mg/dL — ABNORMAL HIGH (ref 0–99)
Triglycerides: 71 mg/dL (ref 0–149)
VLDL Cholesterol Cal: 12 mg/dL (ref 5–40)

## 2022-01-22 LAB — CBC WITH DIFFERENTIAL/PLATELET
Basophils Absolute: 0 10*3/uL (ref 0.0–0.2)
Basos: 1 %
EOS (ABSOLUTE): 0.2 10*3/uL (ref 0.0–0.4)
Eos: 3 %
Hematocrit: 39.6 % (ref 34.0–46.6)
Hemoglobin: 13 g/dL (ref 11.1–15.9)
Immature Grans (Abs): 0 10*3/uL (ref 0.0–0.1)
Immature Granulocytes: 0 %
Lymphocytes Absolute: 2.1 10*3/uL (ref 0.7–3.1)
Lymphs: 35 %
MCH: 28 pg (ref 26.6–33.0)
MCHC: 32.8 g/dL (ref 31.5–35.7)
MCV: 85 fL (ref 79–97)
Monocytes Absolute: 0.5 10*3/uL (ref 0.1–0.9)
Monocytes: 9 %
Neutrophils Absolute: 3.1 10*3/uL (ref 1.4–7.0)
Neutrophils: 52 %
Platelets: 259 10*3/uL (ref 150–450)
RBC: 4.64 x10E6/uL (ref 3.77–5.28)
RDW: 13.2 % (ref 11.7–15.4)
WBC: 5.9 10*3/uL (ref 3.4–10.8)

## 2022-01-22 LAB — TSH: TSH: 1.84 u[IU]/mL (ref 0.450–4.500)

## 2022-01-22 LAB — CMP14+EGFR
ALT: 15 IU/L (ref 0–32)
AST: 16 IU/L (ref 0–40)
Albumin/Globulin Ratio: 2.2 (ref 1.2–2.2)
Albumin: 4.8 g/dL (ref 3.8–4.9)
Alkaline Phosphatase: 66 IU/L (ref 44–121)
BUN/Creatinine Ratio: 17 (ref 9–23)
BUN: 14 mg/dL (ref 6–24)
Bilirubin Total: 0.5 mg/dL (ref 0.0–1.2)
CO2: 23 mmol/L (ref 20–29)
Calcium: 9.6 mg/dL (ref 8.7–10.2)
Chloride: 102 mmol/L (ref 96–106)
Creatinine, Ser: 0.82 mg/dL (ref 0.57–1.00)
Globulin, Total: 2.2 g/dL (ref 1.5–4.5)
Glucose: 92 mg/dL (ref 70–99)
Potassium: 4.2 mmol/L (ref 3.5–5.2)
Sodium: 142 mmol/L (ref 134–144)
Total Protein: 7 g/dL (ref 6.0–8.5)
eGFR: 82 mL/min/{1.73_m2} (ref >59)

## 2022-01-22 LAB — VITAMIN D 25 HYDROXY (VIT D DEFICIENCY, FRACTURES): Vit D, 25-Hydroxy: 10.7 ng/mL — ABNORMAL LOW (ref 30.0–100.0)

## 2022-01-22 MED ORDER — VITAMIN D (ERGOCALCIFEROL) 1.25 MG (50000 UNIT) PO CAPS: 50000.0000 [IU] | ORAL_CAPSULE | ORAL | 3 refills | Status: DC

## 2022-02-15 ENCOUNTER — Other Ambulatory Visit: Payer: Self-pay | Admitting: Family

## 2022-02-15 DIAGNOSIS — Z1231 Encounter for screening mammogram for malignant neoplasm of breast: Secondary | ICD-10-CM

## 2022-03-02 ENCOUNTER — Ambulatory Visit: Payer: 59 | Admitting: Family

## 2022-03-02 ENCOUNTER — Inpatient Hospital Stay: Admission: RE | Admit: 2022-03-02 | Payer: 59 | Source: Ambulatory Visit

## 2022-03-29 ENCOUNTER — Encounter: Payer: 59 | Admitting: Family

## 2022-03-29 NOTE — Patient Instructions (Incomplete)
Our records indicate that you are due for your annual mammogram/breast imaging. While there is no way to prevent breast cancer, early detection provides the best opportunity for curing it. For women over the age of 65, the [facility] recommends a yearly clinical breast exam and a yearly mammogram. These practices have saved thousands of lives. We need your help to ensure that you are receiving optimal medical care. Please call the imaging location that has done you previous mammograms. Please remember to list [place] as your primary care. This helps make sure we receive a report and can update your chart.  Below is the contact information for several local breast imaging centers. You may call the location that works best for you, and they will be happy to assistance in making you an appointment. You do not need an order for a regular screening mammogram. However, if you are having any problems or concerns with you breast area, please let your primary care provider know, and appropriate orders will be placed. Please let our office know if you have any questions or concerns. Or if you need information for another imaging center not on this list or outside of the area. We are commented to working with you on your health care journey.   The mobile unit/bus ([facility]) - they come twice a month to our location.  These appointments can be made through our office or by call [facility] [place]  [place], [place] [address] Phone [phone]  [facility] Radiology Department  [address] [place], [place] [address] [phone]  [facility] of [place])  [phone] [address], [place] [address] [phone]  [place] [address] [place] [address] [phone]  [place]  [address] [place] [address] [phone]  [facility]amm[facility] in [place], [place] [address] [phone]  [place] [facility] [place], [place] [address] [phone]  [facility] at [facility] [place]  [place],  [address] [phone]  [facility] [place] [address] [phone]

## 2022-03-31 ENCOUNTER — Encounter: Payer: Self-pay | Admitting: Family

## 2022-04-27 ENCOUNTER — Encounter: Payer: 59 | Admitting: Family

## 2022-07-28 IMAGING — DX DG CHEST 2V
2 series · 2 of 2 positions shown · non-contrast
Comparison: Chest radiograph dated 04/03/2012.

CLINICAL DATA: 59-year-old female with shortness of breath.

EXAM:
CHEST - 2 VIEW

[chest pa]
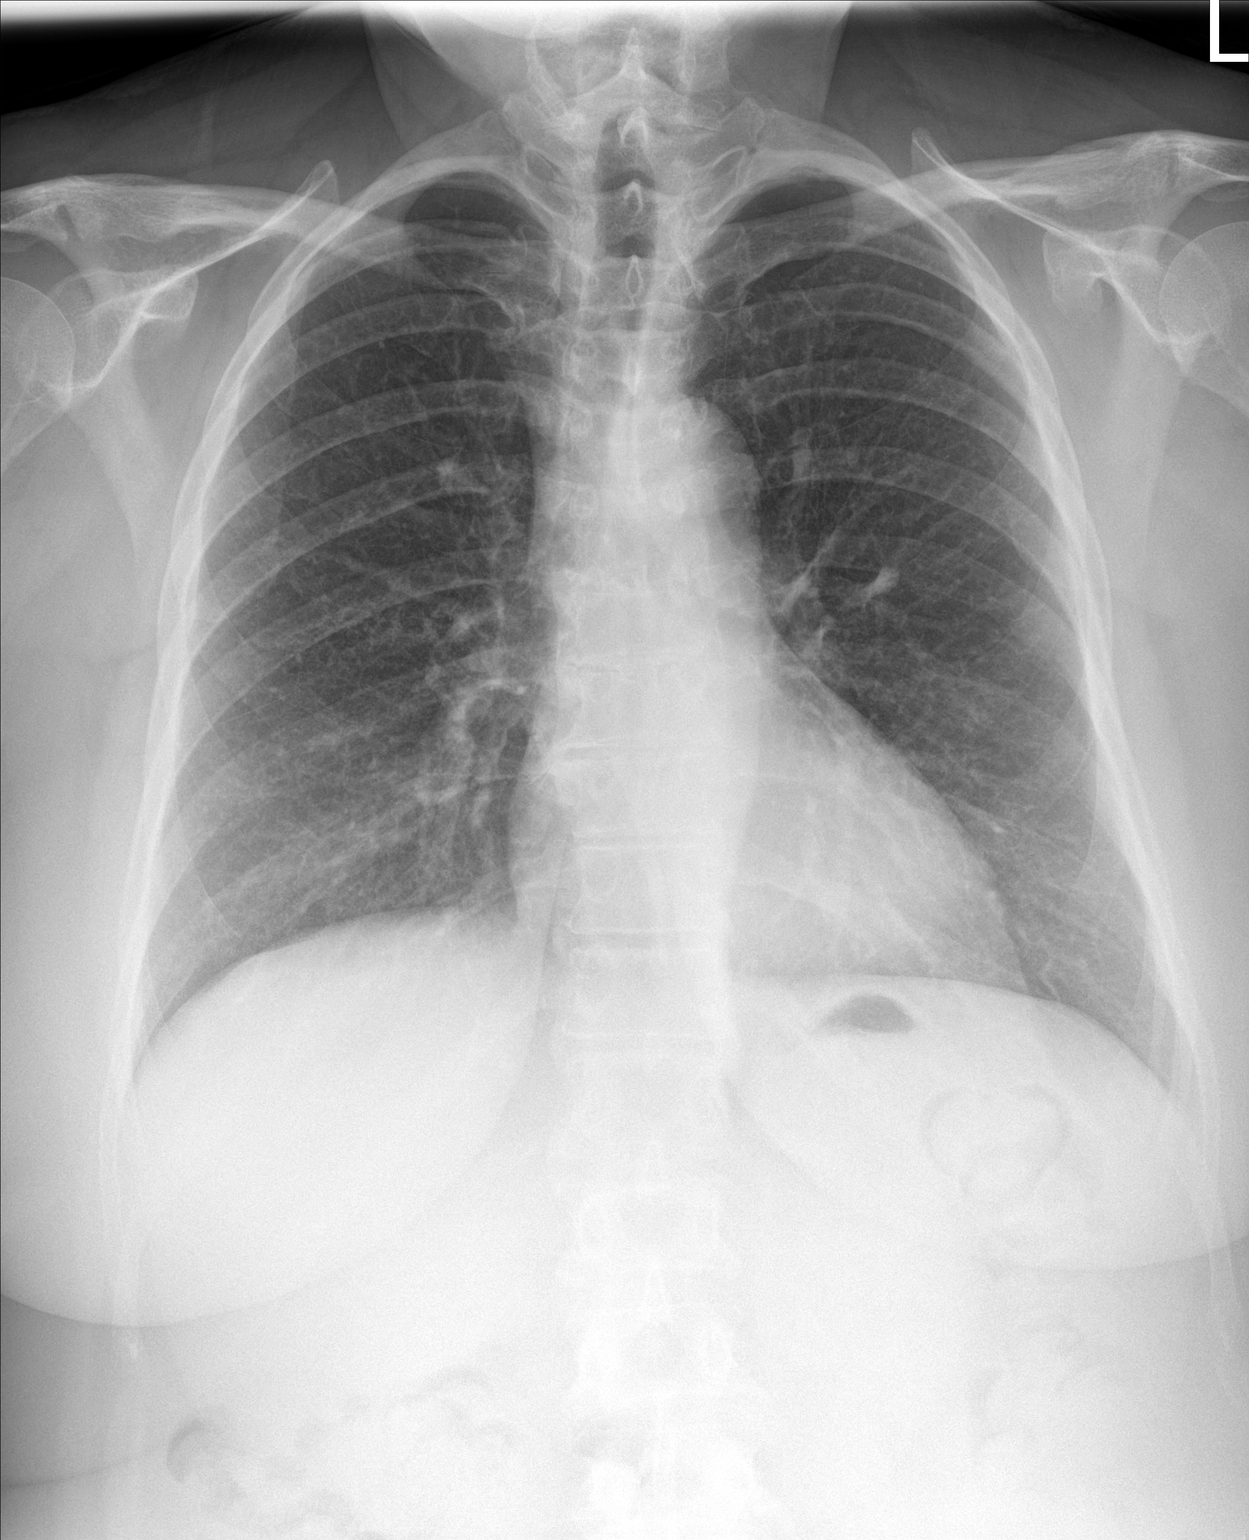

[chest lat]
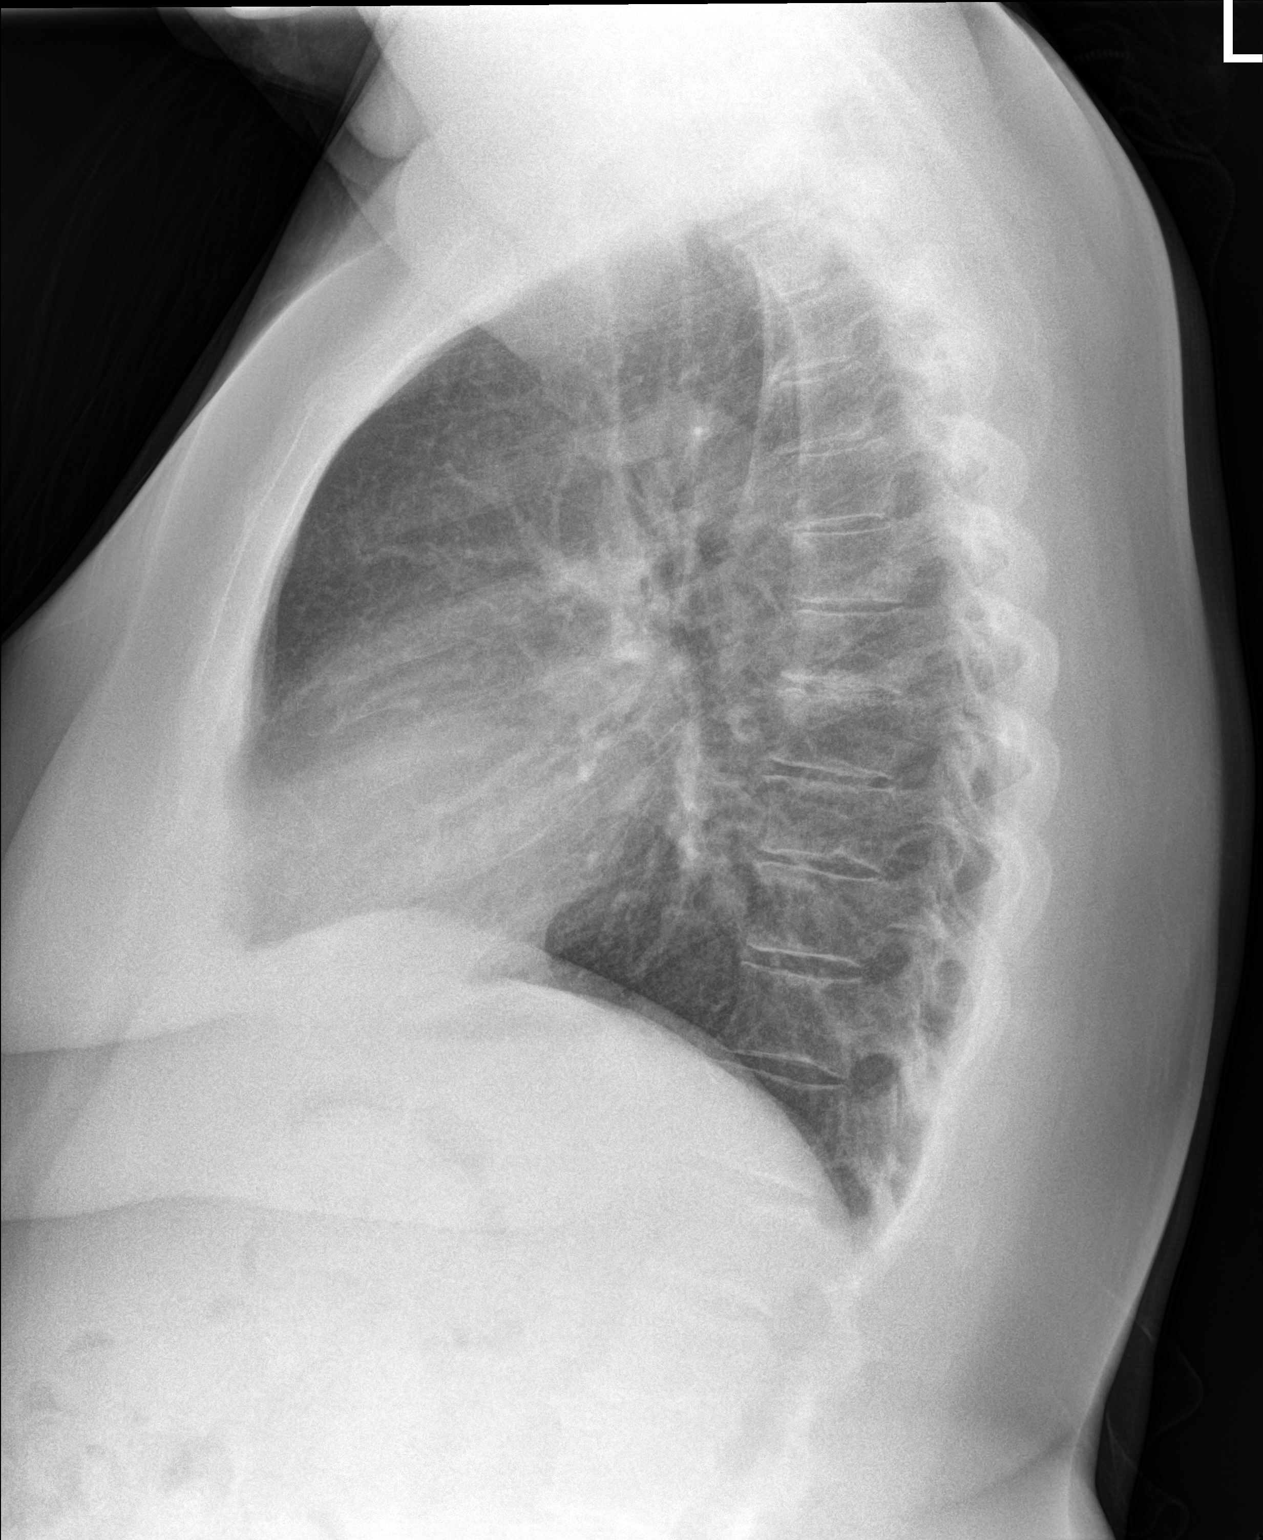

[2 of 2 positions shown; findings below may reference images not displayed]

FINDINGS: No focal consolidation, pleural effusion, pneumothorax. The cardiac
silhouette is within limits. No acute osseous pathology.
IMPRESSION: No active cardiopulmonary disease.

## 2022-09-29 ENCOUNTER — Ambulatory Visit: Payer: 59 | Admitting: Nurse Practitioner

## 2022-09-29 ENCOUNTER — Encounter: Payer: Self-pay | Admitting: Nurse Practitioner

## 2022-09-29 VITALS — BP 138/77 | HR 88 | Temp 97.1°F | Ht 63.0 in | Wt 238.4 lb

## 2022-09-29 DIAGNOSIS — J208 Acute bronchitis due to other specified organisms: Secondary | ICD-10-CM | POA: Insufficient documentation

## 2022-09-29 DIAGNOSIS — J069 Acute upper respiratory infection, unspecified: Secondary | ICD-10-CM | POA: Diagnosis not present

## 2022-09-29 MED ORDER — FLUTICASONE PROPIONATE 50 MCG/ACT NA SUSP: 2.0000 | Freq: Every day | NASAL | 1 refills | Status: DC

## 2022-09-29 MED ORDER — METHYLPREDNISOLONE ACETATE 40 MG/ML IJ SUSP
40.0000 mg | Freq: Once | INTRAMUSCULAR | Status: AC
  Administered 2022-09-29: 40 mg via INTRAMUSCULAR

## 2022-09-29 MED ORDER — BENZONATATE 100 MG PO CAPS: 100.0000 mg | ORAL_CAPSULE | Freq: Three times a day (TID) | ORAL | 0 refills | Status: DC | PRN

## 2022-09-29 NOTE — Progress Notes (Signed)
Acute Office Visit  Subjective:     Patient ID: Kendra Benitez, female    DOB: 03-10-1961, 62 y.o.   MRN: [id number]  Chief Complaint  Patient presents with   Cough    Has had cough for 8 days, coughing stuff up at times. Before had fever, chills, nasal drainage but these have resolved.     HPI   Kendra Benitez is a 62 y.o. female who complains of congestion, cough described as productive of white sputum, myalgias, headache, low grade fevers, white nasal discharge, and hoarseness for 8 days. She denies a history of anorexia, chest pain, dizziness, nausea, shortness of breath, and wheezing and denies a history of asthma. Patient denies smoke cigarettes. Has been taking OTC medications with minimal relieve.    Active Ambulatory Problems    Diagnosis Date Noted   Essential hypertension 10/15/2015   Vitamin D deficiency 10/15/2015   Morbid obesity (HCC) 10/15/2015   Pure hypercholesterolemia 02/05/2020   Upper respiratory infection with cough and congestion 09/29/2022   Acute viral bronchitis 09/29/2022   Resolved Ambulatory Problems    Diagnosis Date Noted   Depression, major, single episode, mild (HCC) 12/25/2018   Anxiety 12/25/2018   Fall 12/19/2019   Past Medical History:  Diagnosis Date   Allergy    GERD (gastroesophageal reflux disease)    Hyperlipidemia      Review of Systems  Constitutional:  Negative for chills, fever and malaise/fatigue.  HENT:  Positive for congestion. Negative for sinus pain and sore throat.   Respiratory:  Positive for cough and sputum production.   Cardiovascular:  Negative for chest pain and palpitations.  Gastrointestinal:  Negative for nausea and vomiting.  Musculoskeletal:  Negative for myalgias and neck pain.  Neurological:  Negative for dizziness, weakness and headaches.   Negative unless indicated in HPI    Objective:    BP 138/77   Pulse 88   Temp (!) 97.1 F (36.2 C) (Temporal)   Ht 5\' 3"  (1.6 m)   Wt 238 lb 6.4 oz (108.1  kg)   LMP 05/13/2013   SpO2 97%   BMI 42.23 kg/m  BP Readings from Last 3 Encounters:  09/29/22 138/77  01/21/22 116/74  12/18/20 (!) 161/82   Wt Readings from Last 3 Encounters:  09/29/22 238 lb 6.4 oz (108.1 kg)  01/21/22 231 lb 3.2 oz (104.9 kg)  12/18/20 232 lb (105.2 kg)      Physical Exam Vitals and nursing note reviewed.  Constitutional:      General: She is not in acute distress.    Appearance: She is obese.  HENT:     Head: Normocephalic and atraumatic.  Eyes:     General: No scleral icterus.    Extraocular Movements: Extraocular movements intact.     Conjunctiva/sclera: Conjunctivae normal.     Pupils: Pupils are equal, round, and reactive to light.  Cardiovascular:     Rate and Rhythm: Normal rate and regular rhythm.  Pulmonary:     Breath sounds: Examination of the right-lower field reveals wheezing. Examination of the left-lower field reveals wheezing. Wheezing and rhonchi present.  Skin:    General: Skin is warm and dry.     Capillary Refill: Capillary refill takes less than 2 seconds.     Coloration: Skin is not jaundiced.     Findings: No rash.  Neurological:     General: No focal deficit present.     Mental Status: She is alert and oriented to person,  place, and time. Mental status is at baseline.  Psychiatric:        Mood and Affect: Mood normal.        Behavior: Behavior normal.        Thought Content: Thought content normal.        Judgment: Judgment normal.     No results found for any visits on 09/29/22.      Assessment & Plan:  Upper respiratory infection with cough and congestion -     Benzonatate; Take 1 capsule (100 mg total) by mouth 3 (three) times daily as needed for cough.  Dispense: 30 capsule; Refill: 0 -     methylPREDNISolone Acetate -     Fluticasone Propionate; Place 2 sprays into both nostrils daily.  Dispense: 16 g; Refill: 1  Acute viral bronchitis -     methylPREDNISolone Acetate   ASSESSMENT:  viral upper  respiratory illness and bronchitis  PLAN: Methylprednisolone 40 mg IM injection Flonase 1 spray I each nostril Tessalon pearls 1 tab TID Increase hydration Lack of antibiotic effectiveness discussed with her.  Call or return to clinic prn if these symptoms worsen or fail to improve as anticipated. Return if symptoms worsen or fail to improve, for with PCP.  Arrie Aran Santa Lighter, DNP [facility] [address] [place], [place] [address] [phone]

## 2022-10-01 ENCOUNTER — Telehealth: Payer: Self-pay | Admitting: Family

## 2022-10-01 MED ORDER — AZITHROMYCIN 250 MG PO TABS: ORAL_TABLET | ORAL | 0 refills | Status: AC

## 2022-10-01 NOTE — Telephone Encounter (Signed)
  Incoming Patient Call  10/01/2022  What symptoms do you have? cough  How long have you been sick? Since 7/16  Have you been seen for this problem? 7/17 - only allergy meds were called in and patient says she is not feeling any better   If your provider decides to give you a prescription, which pharmacy would you like for it to be sent to? [facility]  Patient informed that this information will be sent to the clinical staff for review and that they should receive a follow up call.

## 2022-10-01 NOTE — Telephone Encounter (Signed)
Patient aware and verbalized understanding. °

## 2022-10-01 NOTE — Telephone Encounter (Signed)
Zpak Prescription sent to pharmacy   

## 2022-11-11 ENCOUNTER — Ambulatory Visit: Payer: 59 | Admitting: Family

## 2022-11-11 ENCOUNTER — Encounter: Payer: Self-pay | Admitting: Family

## 2022-11-11 VITALS — BP 123/76 | HR 93 | Temp 97.6°F | Ht 63.0 in | Wt 240.2 lb

## 2022-11-11 DIAGNOSIS — J069 Acute upper respiratory infection, unspecified: Secondary | ICD-10-CM

## 2022-11-11 MED ORDER — FLUTICASONE PROPIONATE 50 MCG/ACT NA SUSP: 2.0000 | Freq: Every day | NASAL | 1 refills | Status: AC

## 2022-11-11 MED ORDER — CETIRIZINE HCL 10 MG PO TABS: 10.0000 mg | ORAL_TABLET | Freq: Every day | ORAL | 1 refills | Status: AC

## 2022-11-11 NOTE — Patient Instructions (Signed)

## 2022-11-11 NOTE — Progress Notes (Signed)
Subjective:    Patient ID: Kendra Benitez, female    DOB: Feb 02, 1961, 62 y.o.   MRN: [id number]  Chief Complaint  Patient presents with   URI   PT presents to the office today with URI symptoms.  URI  This is a new problem. The current episode started in the past 7 days. The problem has been waxing and waning. There has been no fever. Associated symptoms include congestion, coughing, ear pain (started hurting and has resolved now), headaches (improving now), rhinorrhea, sinus pain and sneezing. Pertinent negatives include no joint pain, sore throat or swollen glands. She has tried acetaminophen for the symptoms. The treatment provided mild relief.      Review of Systems  HENT:  Positive for congestion, ear pain (started hurting and has resolved now), rhinorrhea, sinus pain and sneezing. Negative for sore throat.   Respiratory:  Positive for cough.   Musculoskeletal:  Negative for joint pain.  Neurological:  Positive for headaches (improving now).  All other systems reviewed and are negative.      Objective:   Physical Exam Vitals reviewed.  Constitutional:      General: She is not in acute distress.    Appearance: She is well-developed. She is obese.  HENT:     Head: Normocephalic and atraumatic.     Comments: Hoarse voice, erythemas throat     Right Ear: Tympanic membrane normal.     Left Ear: Tympanic membrane normal.  Eyes:     Pupils: Pupils are equal, round, and reactive to light.  Neck:     Thyroid: No thyromegaly.  Cardiovascular:     Rate and Rhythm: Normal rate and regular rhythm.     Heart sounds: Normal heart sounds. No murmur heard. Pulmonary:     Effort: Pulmonary effort is normal. No respiratory distress.     Breath sounds: Normal breath sounds. No wheezing.  Abdominal:     General: Bowel sounds are normal. There is no distension.     Palpations: Abdomen is soft.     Tenderness: There is no abdominal tenderness.  Musculoskeletal:        General: No  tenderness. Normal range of motion.     Cervical back: Normal range of motion and neck supple.  Skin:    General: Skin is warm and dry.  Neurological:     Mental Status: She is alert and oriented to person, place, and time.     Cranial Nerves: No cranial nerve deficit.     Deep Tendon Reflexes: Reflexes are normal and symmetric.  Psychiatric:        Behavior: Behavior normal.        Thought Content: Thought content normal.        Judgment: Judgment normal.       BP 123/76   Pulse 93   Temp 97.6 F (36.4 C) (Temporal)   Ht 5\' 3"  (1.6 m)   Wt 240 lb 3.2 oz (109 kg)   LMP 05/13/2013   SpO2 96%   BMI 42.55 kg/m      Assessment & Plan:   SETA MATSUBARA comes in today with chief complaint of URI   Diagnosis and orders addressed:  1. Viral URI - Take meds as prescribed - Use a cool mist humidifier  -Use saline nose sprays frequently -Force fluids -For any cough or congestion  Use plain Mucinex- regular strength or max strength is fine -For fever or aces or pains- take tylenol or ibuprofen. -Throat lozenges  if help -New toothbrush in 3 days Follow up if symptoms worsen or do not improve  - fluticasone (FLONASE) 50 MCG/ACT nasal spray; Place 2 sprays into both nostrils daily.  Dispense: 16 g; Refill: 1 - cetirizine (ZYRTEC ALLERGY) 10 MG tablet; Take 1 tablet (10 mg total) by mouth daily.  Dispense: 90 tablet; Refill: 1   Jannifer Rodney, FNP

## 2023-02-09 ENCOUNTER — Other Ambulatory Visit: Payer: Self-pay | Admitting: Family

## 2023-02-09 ENCOUNTER — Encounter: Payer: Self-pay | Admitting: Family

## 2023-02-09 DIAGNOSIS — I10 Essential (primary) hypertension: Secondary | ICD-10-CM

## 2023-02-09 NOTE — Telephone Encounter (Signed)
Patient must be seen for refill.

## 2023-02-09 NOTE — Telephone Encounter (Signed)
LMTCB to schedule appt Letter mailed

## 2023-04-11 ENCOUNTER — Encounter: Payer: Self-pay | Admitting: Family

## 2023-04-11 ENCOUNTER — Ambulatory Visit: Payer: 59 | Admitting: Family

## 2023-04-11 VITALS — BP 134/75 | HR 87 | Temp 97.5°F | Ht 63.0 in | Wt 242.6 lb

## 2023-04-11 DIAGNOSIS — E7800 Pure hypercholesterolemia, unspecified: Secondary | ICD-10-CM | POA: Diagnosis not present

## 2023-04-11 DIAGNOSIS — I10 Essential (primary) hypertension: Secondary | ICD-10-CM

## 2023-04-11 DIAGNOSIS — E559 Vitamin D deficiency, unspecified: Secondary | ICD-10-CM

## 2023-04-11 DIAGNOSIS — Z0001 Encounter for general adult medical examination with abnormal findings: Secondary | ICD-10-CM | POA: Diagnosis not present

## 2023-04-11 DIAGNOSIS — H6121 Impacted cerumen, right ear: Secondary | ICD-10-CM

## 2023-04-11 DIAGNOSIS — R5383 Other fatigue: Secondary | ICD-10-CM

## 2023-04-11 DIAGNOSIS — Z0000 Encounter for general adult medical examination without abnormal findings: Secondary | ICD-10-CM

## 2023-04-11 LAB — LIPID PANEL

## 2023-04-11 MED ORDER — VITAMIN D (ERGOCALCIFEROL) 1.25 MG (50000 UNIT) PO CAPS: 50000.0000 [IU] | ORAL_CAPSULE | ORAL | 3 refills | Status: DC

## 2023-04-11 NOTE — Patient Instructions (Addendum)
Fatigue If you have fatigue, you feel tired all the time and have a lack of energy or a lack of motivation. Fatigue may make it difficult to start or complete tasks because of exhaustion. Occasional or mild fatigue is often a normal response to activity or life. However, long-term (chronic) or extreme fatigue may be a symptom of a medical condition such as: Depression. Not having enough red blood cells or hemoglobin in the blood (anemia). A problem with a small gland located in the lower front part of the neck (thyroid disorder). Rheumatologic conditions. These are problems related to the body's defense system (immune system). Infections, especially certain viral infections. Fatigue can also lead to negative health outcomes over time. Follow these instructions at home: Medicines Take over-the-counter and prescription medicines only as told by your health care provider. Take a multivitamin if told by your health care provider. Do not use herbal or dietary supplements unless they are approved by your health care provider. Eating and drinking  Avoid heavy meals in the evening. Eat a well-balanced diet, which includes lean proteins, whole grains, plenty of fruits and vegetables, and low-fat dairy products. Avoid eating or drinking too many products with caffeine in them. Avoid alcohol. Drink enough fluid to keep your urine pale yellow. Activity  Exercise regularly, as told by your health care provider. Use or practice techniques to help you relax, such as yoga, tai chi, meditation, or massage therapy. Lifestyle Change situations that cause you stress. Try to keep your work and personal schedules in balance. Do not use recreational or illegal drugs. General instructions Monitor your fatigue for any changes. Go to bed and get up at the same time every day. Avoid fatigue by pacing yourself during the day and getting enough sleep at night. Maintain a healthy weight. Contact a health care  provider if: Your fatigue does not get better. You have a fever. You suddenly lose or gain weight. You have headaches. You have trouble falling asleep or sleeping through the night. You feel angry, guilty, anxious, or sad. You have swelling in your legs or another part of your body. Get help right away if: You feel confused, feel like you might faint, or faint. Your vision is blurry or you have a severe headache. You have severe pain in your abdomen, your back, or the area between your waist and hips (pelvis). You have chest pain, shortness of breath, or an irregular or fast heartbeat. You are unable to urinate, or you urinate less than normal. You have abnormal bleeding from the rectum, nose, lungs, nipples, or, if you are female, the vagina. You vomit blood. You have thoughts about hurting yourself or others. These symptoms may be an emergency. Get help right away. Call 911. Do not wait to see if the symptoms will go away. Do not drive yourself to the hospital. Get help right away if you feel like you may hurt yourself or others, or have thoughts about taking your own life. Go to your nearest emergency room or: Call 911. Call the National Suicide Prevention Lifeline at (650)264-8468 or 988. This is open 24 hours a day. Text the Crisis Text Line at 612-333-0358. Summary If you have fatigue, you feel tired all the time and have a lack of energy or a lack of motivation. Fatigue may make it difficult to start or complete tasks because of exhaustion. Long-term (chronic) or extreme fatigue may be a symptom of a medical condition. Exercise regularly, as told by your health care provider.  Change situations that cause you stress. Try to keep your work and personal schedules in balance. This information is not intended to replace advice given to you by your health care provider. Make sure you discuss any questions you have with your health care provider.   Vitamin D Deficiency Vitamin D  deficiency is when your body does not have enough vitamin D. Vitamin D is important to your body because: It helps the body maintain calcium and phosphorus levels. These are important minerals. It plays a role in bone health. It reduces inflammation. It improves the body's defense system (immune system). If vitamin D deficiency is severe, it can cause a condition in which your bones become soft. In adults, this condition is called osteomalacia. In children, this condition is called rickets. What are the causes? This condition may be caused by: Not eating enough foods that contain vitamin D. Not getting enough natural sun exposure. Having certain digestive system diseases that make it difficult for your body to absorb vitamin D. These diseases include Crohn's disease, long-term (chronic) pancreatitis, and cystic fibrosis. Having had a surgery in which a part of the stomach or a part of the small intestine was removed. What increases the risk? You are more likely to develop this condition if you: Are an older adult. Do not spend much time outdoors. Live in a long-term care facility. Have dark skin. Take certain medicines, such as steroid medicines or certain seizure medicines. Are overweight or obese. Have chronic kidney or liver disease. What are the signs or symptoms? In mild cases of vitamin D deficiency, there may not be any symptoms. If the condition is severe, symptoms may include: Bone pain. Muscle pain. Not being able to walk normally (abnormal gait). Broken bones caused by a minor injury. Joint pain. How is this diagnosed? This condition may be diagnosed with blood tests. Imaging tests such as X-rays may also be done to look for changes in the bone. How is this treated? Treatment may include taking supplements as told by your health care provider. Your health care provider will tell you what dose is best for you. Supplements may include: Vitamin D. Calcium. Follow these  instructions at home: Eating and drinking Eat foods that contain vitamin D, such as: Dairy products, cereals, or juices that have vitamin D added to them (are fortified). Check the label. Fish, such as salmon or trout. Eggs. The vitamin D is in the yolk. Mushrooms that were treated with UV light. Beef liver. The items listed above may not be a complete list of foods and beverages you can eat and drink. Contact a dietitian for more information. General instructions Take over-the-counter and prescription medicines only as told by your health care provider. Take supplements only as told by your health care provider. Get regular, safe exposure to natural sunlight. Do not use a tanning bed. Maintain a healthy weight. Lose weight if needed. Keep all follow-up visits. This is important. How is this prevented? You can get vitamin D by: Eating foods that naturally contain vitamin D. Eating or drinking products that have been fortified with vitamin D, such as cereals, juices, and dairy products, including milk. Taking a vitamin D supplement or a multivitamin that contains vitamin D. Being in the sun. Your body naturally makes vitamin D when your skin is exposed to sunlight. Your body changes the sunlight into a form of the vitamin that it can use. Contact a health care provider if: Your symptoms do not go away. You feel  nauseous or you vomit. You have fewer bowel movements than usual or are constipated. Summary Vitamin D deficiency is when your body does not have enough vitamin D. Vitamin D helps to keep your bones healthy. Vitamin D deficiency is primarily treated by taking supplements. Your health care provider will suggest what dose is best for you. You can get vitamin D by eating foods that contain vitamin D, by being in the sun, and by taking a vitamin D supplement or a multivitamin that contains vitamin D. This information is not intended to replace advice given to you by your health care  provider. Make sure you discuss any questions you have with your health care provider. Document Revised: 12/05/2020 Document Reviewed: 12/05/2020 Elsevier Patient Education  2024 [employer]. Document Revised: 12/22/2020 Document Reviewed: 12/22/2020 Elsevier Patient Education  2024 [employer].

## 2023-04-11 NOTE — Progress Notes (Signed)
Subjective:    Patient ID: Kendra Benitez, female    DOB: 1960/06/21, 63 y.o.   MRN: [id number]  Chief Complaint  Patient presents with   Medical Management of Chronic Issues    Wants vit d level checked tired all the time    Pt presents to the office today for CPE without pap. She is complaining of fatigue that has been on going for months.  Hypertension This is a chronic problem. The current episode started more than 1 year ago. The problem has been resolved since onset. The problem is uncontrolled. Associated symptoms include malaise/fatigue. Pertinent negatives include no peripheral edema or shortness of breath. Risk factors for coronary artery disease include obesity. The current treatment provides moderate improvement.  Hyperlipidemia This is a chronic problem. The current episode started more than 1 year ago. The problem is uncontrolled. Exacerbating diseases include obesity. Pertinent negatives include no shortness of breath. Current antihyperlipidemic treatment includes diet change. The current treatment provides no improvement of lipids. Risk factors for coronary artery disease include hypertension, a sedentary lifestyle, post-menopausal and dyslipidemia.      Review of Systems  Constitutional:  Positive for malaise/fatigue.  Respiratory:  Negative for shortness of breath.   All other systems reviewed and are negative.   Family History  Problem Relation Age of Onset   Hypertension Mother    Hypertension Father    Diabetes Father    Colon cancer Neg Hx    Colon polyps Neg Hx    Rectal cancer Neg Hx    Stomach cancer Neg Hx    Social History   Socioeconomic History   Marital status: Married    Spouse name: Not on file   Number of children: Not on file   Years of education: Not on file   Highest education level: Not on file  Occupational History   Not on file  Tobacco Use   Smoking status: Never   Smokeless tobacco: Never  Vaping Use   Vaping status: Never Used   Substance and Sexual Activity   Alcohol use: No   Drug use: No   Sexual activity: Not on file  Other Topics Concern   Not on file  Social History Narrative   Not on file   Social Drivers of Health   Financial Resource Strain: Not on file  Food Insecurity: Not on file  Transportation Needs: Not on file  Physical Activity: Not on file  Stress: Not on file  Social Connections: Not on file       Objective:   Physical Exam Vitals reviewed.  Constitutional:      General: She is not in acute distress.    Appearance: She is well-developed. She is obese.  HENT:     Head: Normocephalic and atraumatic.     Right Ear: Tympanic membrane normal. There is impacted cerumen.     Left Ear: Tympanic membrane normal.  Eyes:     Pupils: Pupils are equal, round, and reactive to light.  Neck:     Thyroid: No thyromegaly.  Cardiovascular:     Rate and Rhythm: Normal rate and regular rhythm.     Heart sounds: Normal heart sounds. No murmur heard. Pulmonary:     Effort: Pulmonary effort is normal. No respiratory distress.     Breath sounds: Normal breath sounds. No wheezing.  Abdominal:     General: Bowel sounds are normal. There is no distension.     Palpations: Abdomen is soft.  Tenderness: There is no abdominal tenderness.  Musculoskeletal:        General: No tenderness. Normal range of motion.     Cervical back: Normal range of motion and neck supple.  Skin:    General: Skin is warm and dry.  Neurological:     Mental Status: She is alert and oriented to person, place, and time.     Cranial Nerves: No cranial nerve deficit.     Deep Tendon Reflexes: Reflexes are normal and symmetric.  Psychiatric:        Behavior: Behavior normal.        Thought Content: Thought content normal.        Judgment: Judgment normal.     Right ear washed with warm water and peroxide. Pt tolerated well. TM WNL  BP 134/75   Pulse 87   Temp (!) 97.5 F (36.4 C) (Temporal)   Ht 5\' 3"  (1.6 m)    Wt 242 lb 9.6 oz (110 kg)   LMP 05/13/2013   SpO2 95%   BMI 42.97 kg/m      Assessment & Plan:   Kendra Benitez comes in today with chief complaint of Medical Management of Chronic Issues (Wants vit d level checked tired all the time )   Diagnosis and orders addressed:  1. Annual physical exam (Primary) - CMP14+EGFR - CBC with Differential/Platelet - Lipid panel - TSH - VITAMIN D 25 Hydroxy (Vit-D Deficiency, Fractures) - Vitamin B12  2. Essential hypertension - CMP14+EGFR - CBC with Differential/Platelet  3. Morbid obesity (HCC) - CMP14+EGFR - CBC with Differential/Platelet  4. Pure hypercholesterolemia - CMP14+EGFR - CBC with Differential/Platelet  5. Vitamin D deficiency - Vitamin D, Ergocalciferol, (DRISDOL) 1.25 MG (50000 UNIT) CAPS capsule; Take 1 capsule (50,000 Units total) by mouth every 7 (seven) days.  Dispense: 12 capsule; Refill: 3 - CMP14+EGFR - CBC with Differential/Platelet  6. Other fatigue - CMP14+EGFR - CBC with Differential/Platelet - TSH - VITAMIN D 25 Hydroxy (Vit-D Deficiency, Fractures) - Vitamin B12  7. Impacted cerumen of right ear Resolved  Labs pending Continue current medications  Health Maintenance reviewed Diet and exercise encouraged  Follow up plan: 1 year    Jannifer Rodney, FNP

## 2023-04-12 ENCOUNTER — Other Ambulatory Visit: Payer: Self-pay | Admitting: Family

## 2023-04-12 LAB — CMP14+EGFR
ALT: 16 IU/L (ref 0–32)
AST: 21 IU/L (ref 0–40)
Albumin: 4.6 g/dL (ref 3.8–4.9)
Alkaline Phosphatase: 81 IU/L (ref 44–121)
BUN/Creatinine Ratio: 14 (ref 12–28)
BUN: 11 mg/dL (ref 8–27)
Bilirubin Total: 0.2 mg/dL (ref 0.0–1.2)
CO2: 22 mmol/L (ref 20–29)
Calcium: 9.6 mg/dL (ref 8.7–10.3)
Chloride: 105 mmol/L (ref 96–106)
Creatinine, Ser: 0.78 mg/dL (ref 0.57–1.00)
Globulin, Total: 2.3 g/dL (ref 1.5–4.5)
Glucose: 120 mg/dL — ABNORMAL HIGH (ref 70–99)
Potassium: 3.6 mmol/L (ref 3.5–5.2)
Sodium: 144 mmol/L (ref 134–144)
Total Protein: 6.9 g/dL (ref 6.0–8.5)
eGFR: 87 mL/min/{1.73_m2} (ref >59)

## 2023-04-12 LAB — CBC WITH DIFFERENTIAL/PLATELET
Basophils Absolute: 0 10*3/uL (ref 0.0–0.2)
Basos: 1 %
EOS (ABSOLUTE): 0.2 10*3/uL (ref 0.0–0.4)
Eos: 3 %
Hematocrit: 41.2 % (ref 34.0–46.6)
Hemoglobin: 13.4 g/dL (ref 11.1–15.9)
Immature Grans (Abs): 0.1 10*3/uL (ref 0.0–0.1)
Immature Granulocytes: 1 %
Lymphocytes Absolute: 2.1 10*3/uL (ref 0.7–3.1)
Lymphs: 31 %
MCH: 28 pg (ref 26.6–33.0)
MCHC: 32.5 g/dL (ref 31.5–35.7)
MCV: 86 fL (ref 79–97)
Monocytes Absolute: 0.6 10*3/uL (ref 0.1–0.9)
Monocytes: 9 %
Neutrophils Absolute: 3.8 10*3/uL (ref 1.4–7.0)
Neutrophils: 55 %
Platelets: 258 10*3/uL (ref 150–450)
RBC: 4.78 x10E6/uL (ref 3.77–5.28)
RDW: 13.1 % (ref 11.7–15.4)
WBC: 6.9 10*3/uL (ref 3.4–10.8)

## 2023-04-12 LAB — VITAMIN D 25 HYDROXY (VIT D DEFICIENCY, FRACTURES): Vit D, 25-Hydroxy: 25.6 ng/mL — ABNORMAL LOW (ref 30.0–100.0)

## 2023-04-12 LAB — LIPID PANEL
Cholesterol, Total: 220 mg/dL — ABNORMAL HIGH (ref 100–199)
HDL: 52 mg/dL (ref >39)
LDL CALC COMMENT:: 4.2 ratio (ref 0.0–4.4)
LDL Chol Calc (NIH): 148 mg/dL — ABNORMAL HIGH (ref 0–99)
Triglycerides: 114 mg/dL (ref 0–149)
VLDL Cholesterol Cal: 20 mg/dL (ref 5–40)

## 2023-04-12 LAB — VITAMIN B12: Vitamin B-12: 279 pg/mL (ref 232–1245)

## 2023-04-12 LAB — TSH: TSH: 2.99 u[IU]/mL (ref 0.450–4.500)

## 2023-04-14 ENCOUNTER — Other Ambulatory Visit: Payer: Self-pay | Admitting: Family

## 2023-04-14 DIAGNOSIS — I10 Essential (primary) hypertension: Secondary | ICD-10-CM

## 2023-04-14 NOTE — Telephone Encounter (Signed)
Copied from CRM [id number]. Topic: Clinical - Medication Refill >> Apr 14, 2023 11:16 AM Thomes Dinning wrote: Most Recent Primary Care Visit:  Provider: Jannifer Rodney A  Department: [facility]  Visit Type: OFFICE VISIT  Date: 04/11/2023  Medication: lisinopril (ZESTRIL) 20 MG tablet   Has the patient contacted their pharmacy? Yes (Agent: If no, request that the patient contact the pharmacy for the refill. If patient does not wish to contact the pharmacy document the reason why and proceed with request.) (Agent: If yes, when and what did the pharmacy advise?)  Is this the correct pharmacy for this prescription? Yes If no, delete pharmacy and type the correct one.  This is the patient's preferred pharmacy:  [facility] [address] [place] - [address] [place] [address] Phone: [phone] Fax: [phone]   Has the prescription been filled recently? No  Is the patient out of the medication? Yes  Has the patient been seen for an appointment in the last year OR does the patient have an upcoming appointment? Yes  Can we respond through MyChart? Yes  Agent: Please be advised that Rx refills may take up to 3 business days. We ask that you follow-up with your pharmacy.

## 2023-04-15 MED ORDER — LISINOPRIL 20 MG PO TABS: 20.0000 mg | ORAL_TABLET | Freq: Every day | ORAL | 3 refills | Status: AC

## 2024-03-11 ENCOUNTER — Other Ambulatory Visit: Payer: Self-pay | Admitting: Family

## 2024-03-11 DIAGNOSIS — E559 Vitamin D deficiency, unspecified: Secondary | ICD-10-CM

## 2024-03-12 NOTE — Telephone Encounter (Signed)
 Last OV 04/11/23. Last RF 04/11/23. Next OV 04/12/24

## 2024-04-12 ENCOUNTER — Encounter: Payer: 59 | Admitting: Family
